# Patient Record
Sex: Female | Born: 1990 | Race: Black or African American | Hispanic: No | Marital: Single | Smoking: Never smoker
Health system: Southern US, Community
[De-identification: ages and names within clinical notes are randomized; demographics above are authoritative.]

## PROBLEM LIST (undated history)

## (undated) ENCOUNTER — Inpatient Hospital Stay: Payer: Self-pay

## (undated) DIAGNOSIS — O009 Unspecified ectopic pregnancy without intrauterine pregnancy: Secondary | ICD-10-CM

## (undated) DIAGNOSIS — E611 Iron deficiency: Secondary | ICD-10-CM

## (undated) DIAGNOSIS — D649 Anemia, unspecified: Secondary | ICD-10-CM

## (undated) DIAGNOSIS — J45909 Unspecified asthma, uncomplicated: Secondary | ICD-10-CM

---

## 2012-10-21 DIAGNOSIS — O009 Unspecified ectopic pregnancy without intrauterine pregnancy: Secondary | ICD-10-CM

## 2012-10-21 HISTORY — PX: LAPAROSCOPIC OOPHERECTOMY: SHX6507

## 2012-10-21 HISTORY — DX: Unspecified ectopic pregnancy without intrauterine pregnancy: O00.90

## 2013-04-02 ENCOUNTER — Emergency Department: Payer: Self-pay | Admitting: Emergency Medicine

## 2013-04-02 LAB — COMPREHENSIVE METABOLIC PANEL
Albumin: 3.9 g/dL (ref 3.4–5.0)
BUN: 9 mg/dL (ref 7–18)
Bilirubin,Total: 0.3 mg/dL (ref 0.2–1.0)
Calcium, Total: 8.9 mg/dL (ref 8.5–10.1)
Chloride: 106 mmol/L (ref 98–107)
Co2: 29 mmol/L (ref 21–32)
Creatinine: 0.56 mg/dL — ABNORMAL LOW (ref 0.60–1.30)
Potassium: 3.4 mmol/L — ABNORMAL LOW (ref 3.5–5.1)
SGOT(AST): 19 U/L (ref 15–37)
Sodium: 138 mmol/L (ref 136–145)
Total Protein: 7.8 g/dL (ref 6.4–8.2)

## 2013-04-02 LAB — CBC
HCT: 31.6 % — ABNORMAL LOW (ref 35.0–47.0)
HGB: 10.5 g/dL — ABNORMAL LOW (ref 12.0–16.0)
MCHC: 33.2 g/dL (ref 32.0–36.0)
MCV: 86 fL (ref 80–100)
RBC: 3.68 10*6/uL — ABNORMAL LOW (ref 3.80–5.20)
WBC: 6.2 10*3/uL (ref 3.6–11.0)

## 2013-04-02 LAB — GC/CHLAMYDIA PROBE AMP

## 2013-04-02 LAB — URINALYSIS, COMPLETE
Bilirubin,UR: NEGATIVE
Glucose,UR: NEGATIVE mg/dL (ref 0–75)
Leukocyte Esterase: NEGATIVE
Nitrite: NEGATIVE
Ph: 5 (ref 4.5–8.0)
Protein: NEGATIVE
RBC,UR: 1 /HPF (ref 0–5)
Specific Gravity: 1.028 (ref 1.003–1.030)
Squamous Epithelial: 3

## 2013-04-02 LAB — WET PREP, GENITAL

## 2013-04-15 ENCOUNTER — Emergency Department: Payer: Self-pay | Admitting: Emergency Medicine

## 2013-05-11 ENCOUNTER — Emergency Department: Payer: Self-pay | Admitting: Emergency Medicine

## 2013-05-11 LAB — CBC
HGB: 10.6 g/dL — ABNORMAL LOW (ref 12.0–16.0)
MCH: 27.9 pg (ref 26.0–34.0)
MCV: 86 fL (ref 80–100)
Platelet: 232 10*3/uL (ref 150–440)
RBC: 3.81 10*6/uL (ref 3.80–5.20)
RDW: 14.4 % (ref 11.5–14.5)

## 2013-05-11 LAB — BASIC METABOLIC PANEL
Anion Gap: 4 — ABNORMAL LOW (ref 7–16)
BUN: 14 mg/dL (ref 7–18)
Chloride: 107 mmol/L (ref 98–107)
Co2: 26 mmol/L (ref 21–32)
EGFR (African American): >60
Osmolality: 273 (ref 275–301)
Sodium: 137 mmol/L (ref 136–145)

## 2013-05-11 LAB — URINALYSIS, COMPLETE
Bilirubin,UR: NEGATIVE
Glucose,UR: NEGATIVE mg/dL (ref 0–75)
Ph: 5 (ref 4.5–8.0)
Protein: NEGATIVE
Squamous Epithelial: 7
WBC UR: 1 /HPF (ref 0–5)

## 2014-05-04 ENCOUNTER — Emergency Department: Payer: Self-pay | Admitting: Emergency Medicine

## 2014-05-04 LAB — CBC WITH DIFFERENTIAL/PLATELET
BASOS ABS: 0 10*3/uL (ref 0.0–0.1)
BASOS PCT: 0.4 %
Eosinophil #: 0.3 10*3/uL (ref 0.0–0.7)
Eosinophil %: 4.7 %
HCT: 34.8 % — ABNORMAL LOW (ref 35.0–47.0)
HGB: 10.9 g/dL — AB (ref 12.0–16.0)
Lymphocyte #: 1.6 10*3/uL (ref 1.0–3.6)
Lymphocyte %: 22.6 %
MCH: 26.9 pg (ref 26.0–34.0)
MCHC: 31.3 g/dL — AB (ref 32.0–36.0)
MCV: 86 fL (ref 80–100)
MONOS PCT: 7 %
Monocyte #: 0.5 x10 3/mm (ref 0.2–0.9)
NEUTROS PCT: 65.3 %
Neutrophil #: 4.8 10*3/uL (ref 1.4–6.5)
PLATELETS: 248 10*3/uL (ref 150–440)
RBC: 4.04 10*6/uL (ref 3.80–5.20)
RDW: 14.9 % — ABNORMAL HIGH (ref 11.5–14.5)
WBC: 7.3 10*3/uL (ref 3.6–11.0)

## 2014-05-04 LAB — HCG, QUANTITATIVE, PREGNANCY: Beta Hcg, Quant.: <1 m[IU]/mL — ABNORMAL LOW

## 2014-05-04 LAB — COMPREHENSIVE METABOLIC PANEL
ALBUMIN: 3.8 g/dL (ref 3.4–5.0)
ALK PHOS: 70 U/L
ANION GAP: 8 (ref 7–16)
AST: 22 U/L (ref 15–37)
BUN: 14 mg/dL (ref 7–18)
Bilirubin,Total: 0.3 mg/dL (ref 0.2–1.0)
CHLORIDE: 106 mmol/L (ref 98–107)
CREATININE: 0.69 mg/dL (ref 0.60–1.30)
Calcium, Total: 8.9 mg/dL (ref 8.5–10.1)
Co2: 24 mmol/L (ref 21–32)
EGFR (Non-African Amer.): >60
GLUCOSE: 87 mg/dL (ref 65–99)
OSMOLALITY: 276 (ref 275–301)
POTASSIUM: 3.6 mmol/L (ref 3.5–5.1)
SGPT (ALT): 14 U/L (ref 12–78)
SODIUM: 138 mmol/L (ref 136–145)
Total Protein: 7.9 g/dL (ref 6.4–8.2)

## 2014-05-04 LAB — GC/CHLAMYDIA PROBE AMP

## 2014-05-04 LAB — LIPASE, BLOOD: Lipase: 97 U/L (ref 73–393)

## 2014-05-04 LAB — WET PREP, GENITAL

## 2014-05-14 ENCOUNTER — Emergency Department: Payer: Self-pay | Admitting: Emergency Medicine

## 2014-05-14 LAB — CBC WITH DIFFERENTIAL/PLATELET
BASOS ABS: 0.1 10*3/uL (ref 0.0–0.1)
BASOS PCT: 0.7 %
Eosinophil #: 0.3 10*3/uL (ref 0.0–0.7)
Eosinophil %: 3.9 %
HCT: 34.8 % — AB (ref 35.0–47.0)
HGB: 11.4 g/dL — AB (ref 12.0–16.0)
LYMPHS ABS: 2.8 10*3/uL (ref 1.0–3.6)
LYMPHS PCT: 33.5 %
MCH: 28 pg (ref 26.0–34.0)
MCHC: 32.8 g/dL (ref 32.0–36.0)
MCV: 85 fL (ref 80–100)
Monocyte #: 0.7 x10 3/mm (ref 0.2–0.9)
Monocyte %: 8.2 %
NEUTROS PCT: 53.7 %
Neutrophil #: 4.5 10*3/uL (ref 1.4–6.5)
Platelet: 285 10*3/uL (ref 150–440)
RBC: 4.08 10*6/uL (ref 3.80–5.20)
RDW: 15.5 % — AB (ref 11.5–14.5)
WBC: 8.4 10*3/uL (ref 3.6–11.0)

## 2014-05-14 LAB — URINALYSIS, COMPLETE
Bilirubin,UR: NEGATIVE
Blood: NEGATIVE
Glucose,UR: NEGATIVE mg/dL (ref 0–75)
KETONE: NEGATIVE
LEUKOCYTE ESTERASE: NEGATIVE
NITRITE: NEGATIVE
PH: 6 (ref 4.5–8.0)
Protein: NEGATIVE
SPECIFIC GRAVITY: 1.025 (ref 1.003–1.030)
Squamous Epithelial: 7

## 2014-05-14 LAB — COMPREHENSIVE METABOLIC PANEL
ALBUMIN: 3.9 g/dL (ref 3.4–5.0)
ANION GAP: 8 (ref 7–16)
AST: 27 U/L (ref 15–37)
Alkaline Phosphatase: 82 U/L
BUN: 10 mg/dL (ref 7–18)
Bilirubin,Total: 0.3 mg/dL (ref 0.2–1.0)
CHLORIDE: 104 mmol/L (ref 98–107)
Calcium, Total: 9 mg/dL (ref 8.5–10.1)
Co2: 27 mmol/L (ref 21–32)
Creatinine: 0.73 mg/dL (ref 0.60–1.30)
EGFR (African American): >60
EGFR (Non-African Amer.): >60
GLUCOSE: 93 mg/dL (ref 65–99)
Osmolality: 276 (ref 275–301)
POTASSIUM: 3.7 mmol/L (ref 3.5–5.1)
SGPT (ALT): 20 U/L
Sodium: 139 mmol/L (ref 136–145)
TOTAL PROTEIN: 8.2 g/dL (ref 6.4–8.2)

## 2014-07-05 ENCOUNTER — Emergency Department: Payer: Self-pay | Admitting: Emergency Medicine

## 2014-07-05 LAB — CBC WITH DIFFERENTIAL/PLATELET
Basophil #: 0 10*3/uL (ref 0.0–0.1)
Basophil %: 0.5 %
EOS PCT: 5.4 %
Eosinophil #: 0.5 10*3/uL (ref 0.0–0.7)
HCT: 35.8 % (ref 35.0–47.0)
HGB: 11.4 g/dL — ABNORMAL LOW (ref 12.0–16.0)
Lymphocyte #: 2.5 10*3/uL (ref 1.0–3.6)
Lymphocyte %: 29.7 %
MCH: 27.8 pg (ref 26.0–34.0)
MCHC: 31.7 g/dL — AB (ref 32.0–36.0)
MCV: 88 fL (ref 80–100)
MONO ABS: 0.8 x10 3/mm (ref 0.2–0.9)
Monocyte %: 9.2 %
NEUTROS PCT: 55.2 %
Neutrophil #: 4.7 10*3/uL (ref 1.4–6.5)
PLATELETS: 250 10*3/uL (ref 150–440)
RBC: 4.09 10*6/uL (ref 3.80–5.20)
RDW: 16 % — ABNORMAL HIGH (ref 11.5–14.5)
WBC: 8.5 10*3/uL (ref 3.6–11.0)

## 2014-07-05 LAB — URINALYSIS, COMPLETE
Bilirubin,UR: NEGATIVE
Blood: NEGATIVE
GLUCOSE, UR: NEGATIVE mg/dL (ref 0–75)
Ketone: NEGATIVE
Leukocyte Esterase: NEGATIVE
NITRITE: NEGATIVE
Ph: 5 (ref 4.5–8.0)
Protein: NEGATIVE
RBC,UR: <1 /HPF (ref 0–5)
SPECIFIC GRAVITY: 1.032 (ref 1.003–1.030)
WBC UR: 2 /HPF (ref 0–5)

## 2014-07-05 LAB — COMPREHENSIVE METABOLIC PANEL
Albumin: 3.8 g/dL (ref 3.4–5.0)
Alkaline Phosphatase: 62 U/L
Anion Gap: 5 — ABNORMAL LOW (ref 7–16)
BILIRUBIN TOTAL: 0.3 mg/dL (ref 0.2–1.0)
BUN: 15 mg/dL (ref 7–18)
CALCIUM: 8.9 mg/dL (ref 8.5–10.1)
Chloride: 108 mmol/L — ABNORMAL HIGH (ref 98–107)
Co2: 27 mmol/L (ref 21–32)
Creatinine: 0.72 mg/dL (ref 0.60–1.30)
EGFR (African American): >60
GLUCOSE: 89 mg/dL (ref 65–99)
Osmolality: 280 (ref 275–301)
Potassium: 3.5 mmol/L (ref 3.5–5.1)
SGOT(AST): 12 U/L — ABNORMAL LOW (ref 15–37)
SGPT (ALT): 15 U/L
SODIUM: 140 mmol/L (ref 136–145)
Total Protein: 7.6 g/dL (ref 6.4–8.2)

## 2014-07-16 ENCOUNTER — Emergency Department: Payer: Self-pay | Admitting: Emergency Medicine

## 2014-08-07 ENCOUNTER — Emergency Department: Payer: Self-pay | Admitting: Emergency Medicine

## 2014-08-07 LAB — URINALYSIS, COMPLETE
Bacteria: NONE SEEN
Bilirubin,UR: NEGATIVE
Blood: NEGATIVE
Glucose,UR: NEGATIVE mg/dL (ref 0–75)
KETONE: NEGATIVE
Nitrite: NEGATIVE
Ph: 6 (ref 4.5–8.0)
Protein: NEGATIVE
RBC,UR: 10 /HPF (ref 0–5)
SPECIFIC GRAVITY: 1.026 (ref 1.003–1.030)
Squamous Epithelial: 7

## 2014-11-29 ENCOUNTER — Emergency Department: Payer: Self-pay | Admitting: Emergency Medicine

## 2014-12-10 ENCOUNTER — Emergency Department: Payer: Self-pay | Admitting: Emergency Medicine

## 2015-10-22 NOTE — L&D Delivery Note (Signed)
VAGINAL DELIVERY NOTE:  Date of Delivery: 08/14/2016 Primary OB: [facility]  Gestational Age/EDD: 817w1d 08/13/2016, by Last Menstrual Period Antepartum complications: tachysystole on Cervidil, Varible decels 2nd stage Attending Physician: CJones, CNM Delivery Type: spontaneous vaginal delivery  Anesthesia: none Laceration: n/a and none Episiotomy: none Placenta: spontaneous Intrapartum complications: epidural ineffective and rapid labor process Estimated Blood Loss:3600ml GBS: neg Procedure Details: CTSP due to C/C/+2 with inability to control UC's and pushing. Enc to push and in Lithotomy position. FOB with pt. Vtx, Ant shoulder del and then post shouder and body del completely at  1042, No CAN noted. To mom's abd for skin to skin bonding. Apgars 8-9, SDOP intact at 1045. 3 VC noted. No lacs, FF and lochia mod. After 2 mins, pt began to bleed approx 300 mls with FF and bleeding stopped. Pitocin IV drip running wide open. Needle and sponge count correct. Bonding with baby. EBL: 350 ml's.   Baby: Liveborn female, Apgars 8/9, baby named "Sariyah"

## 2016-01-12 LAB — OB RESULTS CONSOLE HIV ANTIBODY (ROUTINE TESTING): HIV: NONREACTIVE

## 2016-01-12 LAB — OB RESULTS CONSOLE VARICELLA ZOSTER ANTIBODY, IGG: Varicella: IMMUNE

## 2016-01-12 LAB — OB RESULTS CONSOLE RPR: RPR: NONREACTIVE

## 2016-01-12 LAB — OB RESULTS CONSOLE GC/CHLAMYDIA
CHLAMYDIA, DNA PROBE: NEGATIVE
Gonorrhea: NEGATIVE

## 2016-01-12 LAB — OB RESULTS CONSOLE RUBELLA ANTIBODY, IGM: Rubella: IMMUNE

## 2016-01-12 LAB — OB RESULTS CONSOLE HEPATITIS B SURFACE ANTIGEN: Hepatitis B Surface Ag: NEGATIVE

## 2016-07-04 ENCOUNTER — Encounter: Payer: Self-pay | Admitting: *Deleted

## 2016-07-04 ENCOUNTER — Observation Stay
Admission: EM | Admit: 2016-07-04 | Discharge: 2016-07-04 | Disposition: A | Discharge status: Home / Self Care | Payer: Medicaid Other | Attending: Obstetrics and Gynecology | Admitting: Obstetrics and Gynecology

## 2016-07-04 DIAGNOSIS — Z3483 Encounter for supervision of other normal pregnancy, third trimester: Secondary | ICD-10-CM | POA: Diagnosis present

## 2016-07-04 HISTORY — DX: Unspecified asthma, uncomplicated: J45.909

## 2016-07-04 LAB — URINALYSIS COMPLETE WITH MICROSCOPIC (ARMC ONLY)
BILIRUBIN URINE: NEGATIVE
Bacteria, UA: NONE SEEN
Glucose, UA: NEGATIVE mg/dL
Hgb urine dipstick: NEGATIVE
Ketones, ur: NEGATIVE mg/dL
Leukocytes, UA: NEGATIVE
NITRITE: NEGATIVE
PH: 7 (ref 5.0–8.0)
Protein, ur: NEGATIVE mg/dL
SPECIFIC GRAVITY, URINE: 1.013 (ref 1.005–1.030)

## 2016-07-04 MED ORDER — OXYCODONE-ACETAMINOPHEN 5-325 MG PO TABS: 1.0000 | ORAL_TABLET | ORAL | Status: DC | PRN

## 2016-07-04 MED ORDER — ACETAMINOPHEN 325 MG PO TABS: 650.0000 mg | ORAL_TABLET | ORAL | Status: DC | PRN

## 2016-07-04 MED ORDER — LACTATED RINGERS IV SOLN: 500.0000 mL | INTRAVENOUS | Status: DC | PRN

## 2016-07-04 NOTE — OB Triage Note (Signed)
Patient states she lost her mucus plug today at 1230. Pt also states she had been having lower abdominal/vaginal pressure for a week. Denies LOF, Vaginal bleeding or any other concerns.

## 2016-07-04 NOTE — Progress Notes (Signed)
[place] and just moved here to [place]. She is  A Gravida 3 P1011with no  PNC in this area yet. LMP was 11/07/15 with EDD of 08/13/16. [facility] for c/o "losing mucus plug". Past Medical History:  Diagnosis Date  . Asthma    Past Surgical History:  Procedure Laterality Date  . LAPAROSCOPIC OOPHERECTOMY Left 2014   Family History  Problem Relation Age of Onset  . Diabetes Maternal Grandmother   . Heart disease Maternal Grandmother    Social History   Social History  . Marital status: Single    Spouse name: N/A  . Number of children: N/A  . Years of education: N/A   Occupational History  . Not on file.   Social History Main Topics  . Smoking status: Never Smoker  . Smokeless tobacco: Never Used  . Alcohol use No  . Drug use: No  . Sexual activity: Yes    Birth control/ protection: Implant, IUD   Other Topics Concern  . Not on file   Social History Narrative  . No narrative on file  Pt having urine eval which was neg and NST which was reactive with 2 accels 15 x 15 BPM. Pt was not in labor and was discharged with instructions to fu [facility] next week for tansfer of care at 34 weeks. No records available and advised staff to get the records.  Gen: 25 yo black female in NAD.  FHR: 130-135, reactive NST with 2 accels 15 x 15 BPM. Toco: No UC's noted.  A: IUP at 34 weeks with mucus plug expulsion 2. PNC in [place] is lacking due to recent move P: Advised [facility] for transfer of care. 2. FKC's. 3. FU for any labor S/S.

## 2016-07-09 LAB — OB RESULTS CONSOLE GBS: GBS: NEGATIVE

## 2016-07-21 ENCOUNTER — Observation Stay
Admission: EM | Admit: 2016-07-21 | Discharge: 2016-07-21 | Disposition: A | Discharge status: Home / Self Care | Payer: Medicaid Other | Attending: Obstetrics and Gynecology | Admitting: Obstetrics and Gynecology

## 2016-07-21 DIAGNOSIS — Z87891 Personal history of nicotine dependence: Secondary | ICD-10-CM | POA: Insufficient documentation

## 2016-07-21 DIAGNOSIS — O212 Late vomiting of pregnancy: Principal | ICD-10-CM | POA: Insufficient documentation

## 2016-07-21 DIAGNOSIS — O4700 False labor before 37 completed weeks of gestation, unspecified trimester: Secondary | ICD-10-CM | POA: Diagnosis present

## 2016-07-21 DIAGNOSIS — O479 False labor, unspecified: Secondary | ICD-10-CM | POA: Diagnosis present

## 2016-07-21 DIAGNOSIS — Z3A36 36 weeks gestation of pregnancy: Secondary | ICD-10-CM | POA: Insufficient documentation

## 2016-07-21 HISTORY — DX: Unspecified ectopic pregnancy without intrauterine pregnancy: O00.90

## 2016-07-21 HISTORY — DX: Iron deficiency: E61.1

## 2016-07-21 MED ORDER — MORPHINE SULFATE (PF) 2 MG/ML IV SOLN
INTRAVENOUS | Status: AC
Start: 2016-07-21 — End: 2016-07-21
  Administered 2016-07-21: 2 mg via INTRAVENOUS
  Filled 2016-07-21: qty 1

## 2016-07-21 MED ORDER — LACTATED RINGERS IV BOLUS (SEPSIS)
1000.0000 mL | Freq: Once | INTRAVENOUS | Status: AC
  Administered 2016-07-21: 1000 mL via INTRAVENOUS

## 2016-07-21 MED ORDER — PROMETHAZINE HCL 25 MG/ML IJ SOLN
25.0000 mg | Freq: Once | INTRAMUSCULAR | Status: DC
  Filled 2016-07-21: qty 1

## 2016-07-21 MED ORDER — MORPHINE SULFATE (PF) 2 MG/ML IV SOLN
2.0000 mg | Freq: Once | INTRAVENOUS | Status: AC
Start: 2016-07-21 — End: 2016-07-21
  Administered 2016-07-21: 2 mg via INTRAVENOUS

## 2016-07-21 MED ORDER — PROMETHAZINE HCL 25 MG/ML IJ SOLN
INTRAMUSCULAR | Status: AC
  Administered 2016-07-21: 25 mg
  Filled 2016-07-21: qty 1

## 2016-07-21 MED ORDER — SODIUM CHLORIDE FLUSH 0.9 % IV SOLN
INTRAVENOUS | Status: AC
  Administered 2016-07-21: 10 mL
  Filled 2016-07-21: qty 20

## 2016-07-21 MED ORDER — LACTATED RINGERS IV BOLUS (SEPSIS): 500.0000 mL | Freq: Once | INTRAVENOUS | Status: DC

## 2016-07-21 MED ORDER — HYDROCODONE-ACETAMINOPHEN 5-325 MG PO TABS
1.0000 | ORAL_TABLET | Freq: Once | ORAL | Status: AC
  Administered 2016-07-21: 1 via ORAL
  Filled 2016-07-21: qty 1

## 2016-07-21 NOTE — Progress Notes (Signed)
Patient ID: Kayla Rodriguez, female   DOB: 1991/02/22, 25 y.o.   MRN: [id number] Kayla Rodriguez 1991/02/22 G3 P1 at  215w5d presents for n/v and contractions  + LOF , no vaginal bleeding , CTX started at 0400 . Just moved and is initiating [facility]  PmHX : ovarian cyst , ectopic pregnancy, asthma PSHX : ovarian cyst removal  Ros: unremakable Allergies : nkda  Social :former tobacco use .   O;BP 119/77 (BP Location: Right Arm)   Pulse 83   Temp 97.8 F (36.6 C) (Oral)   Resp 16   Ht 5\' 2"  (1.575 m)   Wt 144 lb 12.8 oz (65.7 kg)   LMP 11/07/2015   BMI 26.48 kg/m  ABD gravid , soft  CX 1.5 cm , nitrazine neg by RN  NST reactive  125-130 , + accels , no decels . CTX irregular  Labs: neg Nitrazine  A: n/v with difficulty keeping fluids down  P:1 liter IVF  2 mg Morphine IV  25 mg phenergan  After fluids , if no cervix change d/c home

## 2016-07-21 NOTE — Progress Notes (Signed)
Vicodin given, pt sound asleep, awakes to verbal stimuli and touch. Provided pt and s/o with additional discharge instructions and handout. Advised pt to rest, advance diet as tolerated and follow-up prn. Pt and s/o verbalized understanding. PIV d'ced, pt denies feeling pain while asleep, assisted with getting dressed and accompanied to front lobby via WC where s/o was waiting to drive pt home.

## 2016-07-21 NOTE — Discharge Summary (Signed)
  Patient ID: Kayla Rodriguez, female   DOB: 1991/10/10, 25 y.o.   MRN: [id number] Kayla Nortonshley Hoen 1991/10/10 G3 P1 at  3779w5d presents for n/v and contractions  + LOF , no vaginal bleeding , CTX started at 0400 . [facility]  PmHX : ovarian cyst , ectopic pregnancy, asthma PSHX : ovarian cyst removal  Ros: unremakable Allergies : nkda  Social :former tobacco use .   O;BP 119/77 (BP Location: Right Arm)   Pulse 83   Temp 97.8 F (36.6 C) (Oral)   Resp 16   Ht 5\' 2"  (1.575 m)   Wt 144 lb 12.8 oz (65.7 kg)   LMP 11/07/2015   BMI 26.48 kg/m  ABD gravid , soft  CX 1.5 cm , nitrazine neg by RN  NST reactive  125-130 , + accels , no decels . CTX irregular  Labs: neg Nitrazine  A: n/v with difficulty keeping fluids down  P:1 liter IVF  2 mg Morphine IV  25 mg phenergan  After fluids , if no cervix change d/c home      Electronically signed by Suzy Bouchardhomas J Schermerhorn, MD at 07/21/2016 8:15 PM

## 2016-07-21 NOTE — Discharge Instructions (Signed)
FHT reactive, abdominal pain remains same despite Phenergan and Morphine. Pt states she is drowsy and sleepy but unable to rest because she continues to feel same pain, rates 8/10 and frequency with contractions every 3 mins. VSS, afebrile. Pt tolerating po sips of gingerale and crackers. Vicodin 5/325 mg po given po. Discharge instructions and teaching completed with pt and s/o, preterm labor precautions discussed with s/s that pt shoul report or return to hospital if notices. Pt confirms she lives about 5 mins from hospital and has OB appt scheduled for Thursday. Discharge paperwork including handouts related to preterm labor precautions and diet to follow with nausea/vomiting in pregnancy provided to pt, encouraged pt to rest, take warm shower, stay well hydrated and advance diet as tolerated, then f/u with primary OB as scheduled. Pt is aware she may return to hospital with any worsening symptoms. Pt reports active fetal movement and nausea and vomiting has improved prior to discharge.

## 2016-07-21 NOTE — OB Triage Note (Addendum)
Pt presents to triage with c/o painful contractions felt mostly in lower abdomen, coming every 3 mins since 0430a, says she was asleep, work d/t sharp stomach pain, voided then started feeling nauseated, vomited about 3 times today. Having constipation, pain and pressure with urinating, noticing frequency and hesitancy. This evening began having headache pain, rates overall pain 8/10 Says she has not been able to keep anything down but tolerating popsicles. Confirms active fetal movement. Denies leaking fluid, spotting, vaginal bleeding, diarrhea, fever or chills. Noticed some wetness on panties but thinks it could have be urine while she was throwing up.

## 2016-07-23 ENCOUNTER — Observation Stay
Admission: EM | Admit: 2016-07-23 | Discharge: 2016-07-23 | Disposition: A | Discharge status: Home / Self Care | Payer: Medicaid Other | Attending: Obstetrics and Gynecology | Admitting: Obstetrics and Gynecology

## 2016-07-23 ENCOUNTER — Encounter: Payer: Self-pay | Admitting: *Deleted

## 2016-07-23 DIAGNOSIS — Z3A37 37 weeks gestation of pregnancy: Secondary | ICD-10-CM | POA: Diagnosis not present

## 2016-07-23 DIAGNOSIS — O471 False labor at or after 37 completed weeks of gestation: Principal | ICD-10-CM | POA: Insufficient documentation

## 2016-07-23 DIAGNOSIS — O479 False labor, unspecified: Secondary | ICD-10-CM | POA: Diagnosis present

## 2016-07-23 NOTE — Discharge Instructions (Signed)
Pt. Is to keep [facility]. Labor precautions reviewed with patient, patient verbalized understanding of all discharge instructions. Copy given.

## 2016-07-23 NOTE — OB Triage Note (Signed)
Uterine cramps started Sunday, pt. Found relief when she walks the floor, rest, rocking or S.O. rubs her stomach.  Positive for fetal movement, denies gush of fluid, denies vaginal bleeding, last sexual encounter was yesterday. External monitor - U/S applied - FHR 140s, Toco in place - abd. Soft.

## 2016-07-23 NOTE — Discharge Summary (Signed)
Merita Nortonshley Luzader [birth year] G3 P1 9610w0d presents for uterine ctx noLOF , no vaginal bleeding , O;BP 111/66 (BP Location: Right Arm)   Pulse 89   Temp 98.3 F (36.8 C)   Resp 18   Ht 5\' 2"  (1.575 m)   Wt 144 lb (65.3 kg)   LMP 11/07/2015   BMI 26.34 kg/m  cx by RN 1 cm  NSTreactive NST , no decels   Ano labor  Reassuring fetal monitoring  P:d/c home

## 2016-07-26 ENCOUNTER — Observation Stay
Admission: EM | Admit: 2016-07-26 | Discharge: 2016-07-26 | Disposition: A | Discharge status: Home / Self Care | Payer: Medicaid Other | Attending: Obstetrics and Gynecology | Admitting: Obstetrics and Gynecology

## 2016-07-26 ENCOUNTER — Encounter: Payer: Self-pay | Admitting: *Deleted

## 2016-07-26 DIAGNOSIS — Z3A37 37 weeks gestation of pregnancy: Secondary | ICD-10-CM | POA: Insufficient documentation

## 2016-07-26 DIAGNOSIS — O479 False labor, unspecified: Secondary | ICD-10-CM | POA: Diagnosis present

## 2016-07-26 DIAGNOSIS — O471 False labor at or after 37 completed weeks of gestation: Principal | ICD-10-CM | POA: Insufficient documentation

## 2016-07-26 NOTE — Discharge Summary (Signed)
  37 weeks with UTx ctx . No LOF , No vaginal bleeding  O;VSS NST reactive, no CTX Reactive NST  A: d/c home  Precautions

## 2016-07-26 NOTE — OB Triage Note (Signed)
Recvd to OBS3 with C/O abd pain since last evening and losing mucous plug.  No bleeding or leaking fluid.  Oriented to room and plan of care.  Verbalized understanding and agrees with plan.

## 2016-07-26 NOTE — Progress Notes (Signed)
Discharge instructions given and explained.  Verbalized understanding.  Up to change clothes for discharge.

## 2016-08-03 ENCOUNTER — Inpatient Hospital Stay
Admission: EM | Admit: 2016-08-03 | Discharge: 2016-08-04 | Disposition: A | Discharge status: Home / Self Care | Payer: Medicaid Other | Attending: Obstetrics and Gynecology | Admitting: Obstetrics and Gynecology

## 2016-08-03 DIAGNOSIS — O471 False labor at or after 37 completed weeks of gestation: Secondary | ICD-10-CM | POA: Diagnosis not present

## 2016-08-03 DIAGNOSIS — Z3A38 38 weeks gestation of pregnancy: Secondary | ICD-10-CM | POA: Diagnosis not present

## 2016-08-03 DIAGNOSIS — O212 Late vomiting of pregnancy: Secondary | ICD-10-CM | POA: Diagnosis not present

## 2016-08-03 LAB — TYPE AND SCREEN
ABO/RH(D): O POS
Antibody Screen: NEGATIVE

## 2016-08-03 LAB — CBC
HCT: 38 % (ref 35.0–47.0)
Hemoglobin: 13.4 g/dL (ref 12.0–16.0)
MCH: 31.7 pg (ref 26.0–34.0)
MCHC: 35.2 g/dL (ref 32.0–36.0)
MCV: 90.1 fL (ref 80.0–100.0)
PLATELETS: 207 10*3/uL (ref 150–440)
RBC: 4.21 MIL/uL (ref 3.80–5.20)
RDW: 14.9 % — AB (ref 11.5–14.5)
WBC: 8.1 10*3/uL (ref 3.6–11.0)

## 2016-08-03 MED ORDER — CALCIUM CARBONATE ANTACID 500 MG PO CHEW: 2.0000 | CHEWABLE_TABLET | ORAL | Status: DC | PRN

## 2016-08-03 MED ORDER — LACTATED RINGERS IV SOLN
INTRAVENOUS | Status: DC
  Administered 2016-08-03 – 2016-08-04 (×2): via INTRAVENOUS

## 2016-08-03 MED ORDER — HYDROXYZINE HCL 50 MG PO TABS
50.0000 mg | ORAL_TABLET | Freq: Four times a day (QID) | ORAL | Status: DC | PRN
  Administered 2016-08-03: 50 mg via ORAL
  Filled 2016-08-03 (×2): qty 1

## 2016-08-03 MED ORDER — MORPHINE SULFATE (PF) 4 MG/ML IV SOLN
5.0000 mg | Freq: Once | INTRAVENOUS | Status: AC | PRN
  Administered 2016-08-03: 5 mg via INTRAVENOUS
  Filled 2016-08-03: qty 2

## 2016-08-03 MED ORDER — ACETAMINOPHEN 325 MG PO TABS: 650.0000 mg | ORAL_TABLET | ORAL | Status: DC | PRN

## 2016-08-03 MED ORDER — MORPHINE SULFATE 50 MG/ML IV SOLN: 5.0000 mg/h | Freq: Once | INTRAVENOUS | Status: DC

## 2016-08-03 MED ORDER — MORPHINE SULFATE (PF) 4 MG/ML IV SOLN: 5.0000 mg | Freq: Once | INTRAVENOUS | Status: DC | PRN

## 2016-08-03 MED ORDER — MORPHINE SULFATE (PF) 4 MG/ML IV SOLN
5.0000 mg | Freq: Once | INTRAVENOUS | Status: AC
  Administered 2016-08-03: 5 mg via INTRAMUSCULAR
  Filled 2016-08-03: qty 2

## 2016-08-03 NOTE — OB Triage Note (Signed)
Patient arrived to LDR 2 via EMS with c/o contractions since approx 1800.  Denies leaking of fluid and vaginal bleeding. States contractions are "back to back".  Positive fetal movement. EFM initiated. SVE per RN as noted.  Discussed plan of care with patient. Patient verbalized understanding.

## 2016-08-04 DIAGNOSIS — O471 False labor at or after 37 completed weeks of gestation: Secondary | ICD-10-CM | POA: Diagnosis not present

## 2016-08-04 LAB — URINALYSIS COMPLETE WITH MICROSCOPIC (ARMC ONLY)
BACTERIA UA: NONE SEEN
Bilirubin Urine: NEGATIVE
Glucose, UA: NEGATIVE mg/dL
HGB URINE DIPSTICK: NEGATIVE
Ketones, ur: NEGATIVE mg/dL
LEUKOCYTES UA: NEGATIVE
Nitrite: NEGATIVE
PH: 7 (ref 5.0–8.0)
Protein, ur: NEGATIVE mg/dL
Specific Gravity, Urine: 1.004 — ABNORMAL LOW (ref 1.005–1.030)

## 2016-08-04 LAB — URINE DRUG SCREEN, QUALITATIVE (ARMC ONLY)
Amphetamines, Ur Screen: NOT DETECTED
BARBITURATES, UR SCREEN: NOT DETECTED
BENZODIAZEPINE, UR SCRN: NOT DETECTED
Cannabinoid 50 Ng, Ur ~~LOC~~: NOT DETECTED
Cocaine Metabolite,Ur ~~LOC~~: NOT DETECTED
MDMA (Ecstasy)Ur Screen: NOT DETECTED
METHADONE SCREEN, URINE: NOT DETECTED
Opiate, Ur Screen: POSITIVE — AB
Phencyclidine (PCP) Ur S: NOT DETECTED
TRICYCLIC, UR SCREEN: NOT DETECTED

## 2016-08-04 LAB — COMPREHENSIVE METABOLIC PANEL
ALBUMIN: 3.6 g/dL (ref 3.5–5.0)
ALK PHOS: 142 U/L — AB (ref 38–126)
ALT: 15 U/L (ref 14–54)
AST: 26 U/L (ref 15–41)
Anion gap: 9 (ref 5–15)
BILIRUBIN TOTAL: 0.8 mg/dL (ref 0.3–1.2)
BUN: 6 mg/dL (ref 6–20)
CO2: 22 mmol/L (ref 22–32)
Calcium: 10.1 mg/dL (ref 8.9–10.3)
Chloride: 105 mmol/L (ref 101–111)
Creatinine, Ser: 0.48 mg/dL (ref 0.44–1.00)
GFR calc Af Amer: >60 mL/min (ref >60)
GFR calc non Af Amer: >60 mL/min (ref >60)
GLUCOSE: 85 mg/dL (ref 65–99)
POTASSIUM: 3.4 mmol/L — AB (ref 3.5–5.1)
Sodium: 136 mmol/L (ref 135–145)
TOTAL PROTEIN: 7.7 g/dL (ref 6.5–8.1)

## 2016-08-04 MED ORDER — PROMETHAZINE HCL 25 MG/ML IJ SOLN: 12.5000 mg | Freq: Four times a day (QID) | INTRAMUSCULAR | Status: DC | PRN

## 2016-08-04 MED ORDER — MORPHINE SULFATE (PF) 4 MG/ML IV SOLN
4.0000 mg | Freq: Once | INTRAVENOUS | Status: AC | PRN
  Administered 2016-08-04: 4 mg via INTRAVENOUS
  Filled 2016-08-04: qty 1

## 2016-08-04 MED ORDER — ONDANSETRON HCL 4 MG/2ML IJ SOLN
4.0000 mg | Freq: Three times a day (TID) | INTRAMUSCULAR | Status: DC | PRN
  Administered 2016-08-04: 4 mg via INTRAVENOUS
  Filled 2016-08-04: qty 2

## 2016-08-04 MED ORDER — MORPHINE SULFATE (PF) 4 MG/ML IV SOLN
5.0000 mg | Freq: Once | INTRAVENOUS | Status: AC
  Administered 2016-08-04: 5 mg via INTRAMUSCULAR
  Filled 2016-08-04: qty 2

## 2016-08-04 NOTE — Final Progress Note (Signed)
TRIAGE VISIT with NST   Kayla Rodriguez is a 25 y.o. G3P1011. She is at 5547w5d gestation.  She presented last night in significant pain with vomiting and contractions, looking as though she were about to deliver. However, her cervix was 2 cm dilated. Good fetal movement, no LOF. She was admitted for overnight observation and morphine rested with sleeping. Her pain increased as the morphine wore off, but despite intermittent contractions did not make cervical change. Her fetal monitoring was reassuring with +accels, no decels (apparent ones while she was sleeping were positional artifacts of the monitor) and moderate variability.  Of note, her UDS was positive for opiates after she received her first dose of morphine.  Her labs and vitals were normal. Her CMP is still pending.  O:  BP 109/63   Pulse 72   Temp 98.4 F (36.9 C) (Oral)   Resp 20   Ht 5\' 2"  (1.575 m)   Wt 147 lb (66.7 kg)   LMP 11/07/2015   SpO2 100%   BMI 26.89 kg/m  Results for orders placed or performed during the hospital encounter of 08/03/16 (from the past 48 hour(s))  CBC on admission   Collection Time: 08/03/16  8:59 PM  Result Value Ref Range   WBC 8.1 3.6 - 11.0 K/uL   RBC 4.21 3.80 - 5.20 MIL/uL   Hemoglobin 13.4 12.0 - 16.0 g/dL   HCT 16.138.0 09.635.0 - 04.547.0 %   MCV 90.1 80.0 - 100.0 fL   MCH 31.7 26.0 - 34.0 pg   MCHC 35.2 32.0 - 36.0 g/dL   RDW 40.914.9 (H) 81.111.5 - 91.414.5 %   Platelets 207 150 - 440 K/uL  Type and screen [facility]   Collection Time: 08/03/16  8:59 PM  Result Value Ref Range   ABO/RH(D) O POS    Antibody Screen NEG    Sample Expiration 08/06/2016   Urinalysis complete, with microscopic ([facility] only)   Collection Time: 08/03/16  9:01 PM  Result Value Ref Range   Color, Urine STRAW (A) YELLOW   APPearance CLEAR (A) CLEAR   Glucose, UA NEGATIVE NEGATIVE mg/dL   Bilirubin Urine NEGATIVE NEGATIVE   Ketones, ur NEGATIVE NEGATIVE mg/dL   Specific Gravity, Urine 1.004 (L) 1.005 -  1.030   Hgb urine dipstick NEGATIVE NEGATIVE   pH 7.0 5.0 - 8.0   Protein, ur NEGATIVE NEGATIVE mg/dL   Nitrite NEGATIVE NEGATIVE   Leukocytes, UA NEGATIVE NEGATIVE   RBC / HPF 0-5 0 - 5 RBC/hpf   WBC, UA 0-5 0 - 5 WBC/hpf   Bacteria, UA NONE SEEN NONE SEEN   Squamous Epithelial / LPF 0-5 (A) NONE SEEN   Mucous PRESENT   Urine Drug Screen, Qualitative ([facility] only)   Collection Time: 08/03/16  9:02 PM  Result Value Ref Range   Tricyclic, Ur Screen NONE DETECTED NONE DETECTED   Amphetamines, Ur Screen NONE DETECTED NONE DETECTED   MDMA (Ecstasy)Ur Screen NONE DETECTED NONE DETECTED   Cocaine Metabolite,Ur [place] NONE DETECTED NONE DETECTED   Opiate, Ur Screen POSITIVE (A) NONE DETECTED   Phencyclidine (PCP) Ur S NONE DETECTED NONE DETECTED   Cannabinoid 50 Ng, Ur [place] NONE DETECTED NONE DETECTED   Barbiturates, Ur Screen NONE DETECTED NONE DETECTED   Benzodiazepine, Ur Scrn NONE DETECTED NONE DETECTED   Methadone Scn, Ur NONE DETECTED NONE DETECTED     Gen: NAD, AAOx3      Abd: FNTTP      Ext: Non-tender, Nonedmeatous  FHT: 130, mod var, +accels, no decels TOCO: quiet SVE: Dilation: 2 Effacement (%): 50 Cervical Position: Posterior Station: -3 Presentation: Vertex Exam by:: [facility] RN   A/P:  25 y.o. G3P1011 [redacted]w[redacted]d with contractions, not in active labor.   Labor: not present.    Fetal Wellbeing: NST is Reassuring Cat 1 tracing.  D/c home stable, precautions reviewed, follow-up as scheduled.

## 2016-08-04 NOTE — OB Triage Note (Signed)
Pt given IM med as ordered and discharged to home via wheelchair. Accompanied by significant other

## 2016-08-05 LAB — RPR: RPR: NONREACTIVE

## 2016-08-09 ENCOUNTER — Encounter: Payer: Self-pay | Admitting: *Deleted

## 2016-08-09 ENCOUNTER — Observation Stay
Admission: EM | Admit: 2016-08-09 | Discharge: 2016-08-09 | Disposition: A | Discharge status: Home / Self Care | Payer: Medicaid Other | Attending: Obstetrics and Gynecology | Admitting: Obstetrics and Gynecology

## 2016-08-09 DIAGNOSIS — K5900 Constipation, unspecified: Secondary | ICD-10-CM | POA: Diagnosis not present

## 2016-08-09 DIAGNOSIS — O99613 Diseases of the digestive system complicating pregnancy, third trimester: Principal | ICD-10-CM | POA: Insufficient documentation

## 2016-08-09 DIAGNOSIS — Z3A39 39 weeks gestation of pregnancy: Secondary | ICD-10-CM | POA: Diagnosis not present

## 2016-08-09 MED ORDER — LACTATED RINGERS IV SOLN: 500.0000 mL | INTRAVENOUS | Status: DC | PRN

## 2016-08-09 MED ORDER — OXYCODONE-ACETAMINOPHEN 5-325 MG PO TABS: 1.0000 | ORAL_TABLET | ORAL | Status: DC | PRN

## 2016-08-09 MED ORDER — ACETAMINOPHEN 325 MG PO TABS: 650.0000 mg | ORAL_TABLET | ORAL | Status: DC | PRN

## 2016-08-09 NOTE — Discharge Summary (Signed)
Patient discharged home, discharge instructions given, patient states understanding. Patient left floor in stable condition, denies any other needs at this time. Patient to keep scheduled induction date

## 2016-08-09 NOTE — OB Triage Note (Signed)
Patient states she she had her membranes stripped yesterday and started having "pressure" pain in her abdomen and "stabbing in her vaginal area since 0500. Denies any bleeding, states baby is moving and that she has lost her mucus plug. Pt states she has not had a BM in about a week and it was small, and has not had a normal BM in 2 weeks and her biggest complaint is she feels she needs to "poop" but is scared to push.

## 2016-08-09 NOTE — Discharge Summary (Signed)
Obstetric Discharge Summary   Patient ID: Kayla Rodriguez MRN: [id number] DOB/AGE: [birth year] 25 y.o.   Date of Admission: 08/09/2016  Date of Discharge: 08/09/16  Admitting Diagnosis: IUP at 3580w3d  Secondary Diagnosis: Constipation     Discharge Diagnosis: Term pregnancy with constipation, NST with reactive strip, 2 accels 15 x 15 BPM noted   Antepartum Procedures: Soap suds enema   Post partum procedures: None  Complications:[facility]  [facility]  Kayla Rodriguez is a [id number] with EDD of August 12, 2016 and is scheduled for IOL who had c/o "stomach pain and has not had a BM for 2 weeks. Pt has some UC's but, is hurting due to constipation.Patient had an uncomplicated antenatal course.  By time of discharge, pt had a huge result of BM and feels somewhat better. She has some UC's but, is not in labor. Cx non-reachable by RN. Pt was 4 cms at office in the past.Pt given instructions for constipation and will fu here if Labor begins.   Labs: CBC Latest Ref Rng & Units 08/03/2016 07/05/2014 05/14/2014  WBC 3.6 - 11.0 K/uL 8.1 8.5 8.4  Hemoglobin 12.0 - 16.0 g/dL 44.013.4 11.4(L) 11.4(L)  Hematocrit 35.0 - 47.0 % 38.0 35.8 34.8(L)  Platelets 150 - 440 K/uL 207 250 285   O POS  Physical exam:  Blood pressure 110/68, pulse 83, temperature 98.7 F (37.1 C), temperature source Oral, resp. rate 18, last menstrual period 11/07/2015. General: alert and no distress Lungs: CTA bilat, no W/R/R. Resp: reg and non-labored.  Abdomen:Gravid. Extremities: No evidence of DVT seen on physical exam. No lower extremity edema.  Discharge Instructions: Per After Visit Summary. Activity: Advance as tolerated.  Also refer to After Visit Summary Diet: Regular  Outpatient follow up:   Discharged Condition: stable  Discharged NU:UVOZto:home   Sharee Pimplearon W Jones, CNM 08/09/2016

## 2016-08-09 NOTE — Discharge Instructions (Signed)

## 2016-08-12 ENCOUNTER — Other Ambulatory Visit: Payer: Self-pay | Admitting: Obstetrics and Gynecology

## 2016-08-12 NOTE — H&P (Signed)
OB ADMISSION/ HISTORY & PHYSICAL:  Admission Date: 08/13/16  Admit Diagnosis: Elective IOL at 40 weeks   Kayla Rodriguez is a 25 y.o. female G3P1 at 5640 weeks presenting for elective IOL.   Prenatal History: G3P1011   EDC : 08/13/2016, by Last Menstrual Period  [facility] Prenatal course complicated by late transfer to care, low lying placenta - resolved, anemia on FE BID  Prenatal Labs: ABO, Rh: --/--/O POS (10/14 2059) Antibody: NEG (10/14 2059) Rubella: Immune (03/24 0000)  RPR: Non Reactive (10/14 2059)  HBsAg: Negative (03/24 0000)  HIV: Non-reactive (03/24 0000)  GTT: 134 Varicella: Immune GBS: Negative (09/19 0000)   Medical / Surgical History :  Past medical history:  Past Medical History:  Diagnosis Date  . Asthma    as needed medications, no recent flare ups  . Ectopic pregnancy 2014  . Iron deficiency      Past surgical history:  Past Surgical History:  Procedure Laterality Date  . LAPAROSCOPIC OOPHERECTOMY Left 2014    Family History:  Family History  Problem Relation Age of Onset  . Diabetes Maternal Grandmother   . Heart disease Maternal Grandmother      Social History:  reports that she has never smoked. She has never used smokeless tobacco. She reports that she does not drink alcohol or use drugs.   Allergies: Review of patient's allergies indicates no known allergies.    Current Medications at time of admission:  Prior to Admission medications   Medication Sig Start Date End Date Taking? Authorizing Provider  ferrous sulfate 325 (65 FE) MG tablet Take 325 mg by mouth daily with breakfast.    Historical Provider, MD  Prenatal Vit-Fe Fumarate-FA (PRENATAL VITAMIN PO) Take by mouth.    Historical Provider, MD     Review of Systems: Active FM Irregular BH ctxs No LOF  / SROM @ No bloody show    Physical Exam:  VS: Last menstrual period 11/07/2015.  General: alert and oriented, appears calm Heart:  RRR Lungs: Clear lung fields Abdomen: Gravid, soft and non-tender, non-distended / uterus: gravid, non-tender Extremities: no edema  Genitalia / VE:  3.5cm/70%/-2/BBOW with ctxs/vtx Assessment: [redacted] weeks gestation Elective induction stage of labor  Plan:  1. Admit to Birth Place for Elective Induction of Labor    - Routine labor and delivery orders    - Pitocin begin at 2 milliunits and increase by 2 mu    - Stadol 1mg  every 1 hour PRN 2. GBS Negative    - No prophylaxis indicated 3. Postpartum:      - Breast/Bottle    - Contraception: IUD or Nexplanon 4. Anticipate NSVD    - Proven pelvis 6#7oz  Dr. Feliberto GottronSchermerhorn notified of admission / plan of care  Kayla Rodriguez, CNM

## 2016-08-13 ENCOUNTER — Encounter: Payer: Self-pay | Admitting: *Deleted

## 2016-08-13 ENCOUNTER — Inpatient Hospital Stay
Admission: EM | Admit: 2016-08-13 | Discharge: 2016-08-15 | DRG: 775 | Disposition: A | Discharge status: Home / Self Care | Payer: Medicaid Other | Attending: Obstetrics and Gynecology | Admitting: Obstetrics and Gynecology

## 2016-08-13 DIAGNOSIS — J45909 Unspecified asthma, uncomplicated: Secondary | ICD-10-CM | POA: Diagnosis present

## 2016-08-13 DIAGNOSIS — Z833 Family history of diabetes mellitus: Secondary | ICD-10-CM | POA: Diagnosis not present

## 2016-08-13 DIAGNOSIS — K219 Gastro-esophageal reflux disease without esophagitis: Secondary | ICD-10-CM | POA: Diagnosis present

## 2016-08-13 DIAGNOSIS — O9952 Diseases of the respiratory system complicating childbirth: Secondary | ICD-10-CM | POA: Diagnosis present

## 2016-08-13 DIAGNOSIS — D649 Anemia, unspecified: Secondary | ICD-10-CM | POA: Diagnosis present

## 2016-08-13 DIAGNOSIS — Z3A40 40 weeks gestation of pregnancy: Secondary | ICD-10-CM | POA: Diagnosis not present

## 2016-08-13 DIAGNOSIS — O9902 Anemia complicating childbirth: Secondary | ICD-10-CM | POA: Diagnosis present

## 2016-08-13 DIAGNOSIS — Z87891 Personal history of nicotine dependence: Secondary | ICD-10-CM | POA: Diagnosis not present

## 2016-08-13 DIAGNOSIS — Z8249 Family history of ischemic heart disease and other diseases of the circulatory system: Secondary | ICD-10-CM

## 2016-08-13 DIAGNOSIS — O9962 Diseases of the digestive system complicating childbirth: Secondary | ICD-10-CM | POA: Diagnosis present

## 2016-08-13 DIAGNOSIS — O26893 Other specified pregnancy related conditions, third trimester: Secondary | ICD-10-CM | POA: Diagnosis present

## 2016-08-13 HISTORY — DX: Anemia, unspecified: D64.9

## 2016-08-13 LAB — CBC
HEMATOCRIT: 33.1 % — AB (ref 35.0–47.0)
HEMOGLOBIN: 11.4 g/dL — AB (ref 12.0–16.0)
MCH: 31.1 pg (ref 26.0–34.0)
MCHC: 34.3 g/dL (ref 32.0–36.0)
MCV: 90.6 fL (ref 80.0–100.0)
Platelets: 178 10*3/uL (ref 150–440)
RBC: 3.65 MIL/uL — ABNORMAL LOW (ref 3.80–5.20)
RDW: 15.6 % — ABNORMAL HIGH (ref 11.5–14.5)
WBC: 5.9 10*3/uL (ref 3.6–11.0)

## 2016-08-13 LAB — TYPE AND SCREEN
ABO/RH(D): O POS
ANTIBODY SCREEN: NEGATIVE

## 2016-08-13 LAB — CHLAMYDIA/NGC RT PCR (ARMC ONLY)
CHLAMYDIA TR: NOT DETECTED
N GONORRHOEAE: NOT DETECTED

## 2016-08-13 MED ORDER — OXYTOCIN BOLUS FROM INFUSION
500.0000 mL | Freq: Once | INTRAVENOUS | Status: AC
  Administered 2016-08-14: 500 mL via INTRAVENOUS

## 2016-08-13 MED ORDER — DINOPROSTONE 10 MG VA INST
10.0000 mg | VAGINAL_INSERT | Freq: Once | VAGINAL | Status: AC
Start: 2016-08-13 — End: 2016-08-13
  Administered 2016-08-13: 10 mg via VAGINAL
  Filled 2016-08-13: qty 1

## 2016-08-13 MED ORDER — LACTATED RINGERS IV SOLN
INTRAVENOUS | Status: DC
  Administered 2016-08-13 – 2016-08-15 (×3): via INTRAVENOUS

## 2016-08-13 MED ORDER — ZOLPIDEM TARTRATE 5 MG PO TABS
5.0000 mg | ORAL_TABLET | Freq: Once | ORAL | Status: AC | PRN
  Administered 2016-08-13: 5 mg via ORAL
  Filled 2016-08-13: qty 1

## 2016-08-13 MED ORDER — ONDANSETRON HCL 4 MG/2ML IJ SOLN
4.0000 mg | Freq: Four times a day (QID) | INTRAMUSCULAR | Status: DC | PRN
  Administered 2016-08-14: 4 mg via INTRAVENOUS
  Filled 2016-08-13: qty 2

## 2016-08-13 MED ORDER — TERBUTALINE SULFATE 1 MG/ML IJ SOLN
0.2500 mg | Freq: Once | INTRAMUSCULAR | Status: DC | PRN
  Filled 2016-08-13: qty 1

## 2016-08-13 MED ORDER — ACETAMINOPHEN 325 MG PO TABS: 650.0000 mg | ORAL_TABLET | ORAL | Status: DC | PRN

## 2016-08-13 MED ORDER — SOD CITRATE-CITRIC ACID 500-334 MG/5ML PO SOLN
30.0000 mL | ORAL | Status: DC | PRN
  Filled 2016-08-13: qty 30

## 2016-08-13 MED ORDER — LACTATED RINGERS IV SOLN: 500.0000 mL | INTRAVENOUS | Status: DC | PRN

## 2016-08-13 MED ORDER — OXYTOCIN 40 UNITS IN LACTATED RINGERS INFUSION - SIMPLE MED
2.5000 [IU]/h | INTRAVENOUS | Status: DC
  Administered 2016-08-14: 2.5 [IU]/h via INTRAVENOUS

## 2016-08-13 MED ORDER — LIDOCAINE HCL (PF) 1 % IJ SOLN: 30.0000 mL | INTRAMUSCULAR | Status: DC | PRN

## 2016-08-13 MED ORDER — MISOPROSTOL 200 MCG PO TABS
ORAL_TABLET | ORAL | Status: AC
  Filled 2016-08-13: qty 4

## 2016-08-13 MED ORDER — OXYTOCIN 40 UNITS IN LACTATED RINGERS INFUSION - SIMPLE MED
1.0000 m[IU]/min | INTRAVENOUS | Status: DC
  Filled 2016-08-13: qty 1000

## 2016-08-13 MED ORDER — AMMONIA AROMATIC IN INHA
RESPIRATORY_TRACT | Status: AC
  Filled 2016-08-13: qty 10

## 2016-08-13 MED ORDER — OXYTOCIN 10 UNIT/ML IJ SOLN
INTRAMUSCULAR | Status: AC
  Filled 2016-08-13: qty 2

## 2016-08-13 MED ORDER — BUTORPHANOL TARTRATE 1 MG/ML IJ SOLN
1.0000 mg | INTRAMUSCULAR | Status: DC | PRN
  Administered 2016-08-14 (×2): 1 mg via INTRAVENOUS
  Filled 2016-08-13 (×2): qty 1

## 2016-08-13 MED ORDER — LIDOCAINE HCL (PF) 1 % IJ SOLN
INTRAMUSCULAR | Status: DC
Start: 2016-08-13 — End: 2016-08-14
  Filled 2016-08-13: qty 30

## 2016-08-13 NOTE — H&P (Signed)
Kayla Rodriguez is a 25 y.o. female presenting for induction by MS for 40 weeks and the patients desire to be delivered . Marland Kitchen. OB History    Gravida Para Term Preterm AB Living   3 1 1   1 1    SAB TAB Ectopic Multiple Live Births   1       1     Past Medical History:  Diagnosis Date  . Anemia   . Asthma    as needed medications, no recent flare ups  . Ectopic pregnancy 2014  . Iron deficiency    Past Surgical History:  Procedure Laterality Date  . LAPAROSCOPIC OOPHERECTOMY Left 2014   Family History: family history includes Diabetes in her maternal grandmother; Heart disease in her maternal grandmother. Social History:  reports that she has never smoked. She has never used smokeless tobacco. She reports that she does not drink alcohol or use drugs.     Maternal Diabetes: No Genetic Screening: Normal Maternal Ultrasounds/Referrals: Normal Fetal Ultrasounds or other Referrals:  None Maternal Substance Abuse:  No Significant Maternal Medications:  None Significant Maternal Lab Results:  None Other Comments:  None  ROS History Dilation: 2 Effacement (%): 70 Exam by:: Dr. Feliberto GottronSchermerhorn Blood pressure 119/71, pulse (!) 59, temperature 98.2 F (36.8 C), temperature source Oral, resp. rate 18, height 5\' 2"  (1.575 m), weight 152 lb 3.2 oz (69 kg), last menstrual period 11/07/2015. Exam  cx 2 cm/ 70 / vtx Physical Exam  Lungs CTA  CV RRR   Prenatal labs: ABO, Rh: --/--/O POS (10/24 1752) Antibody: NEG (10/24 1752) Rubella: Immune (03/24 0000) RPR: Non Reactive (10/14 2059)  HBsAg: Negative (03/24 0000)  HIV: Non-reactive (03/24 0000)  GBS: Negative (09/19 0000)   Assessment/Plan: Induction , social  cerrvidil tonight    Kayla Rodriguez 08/13/2016, 8:05 PM

## 2016-08-13 NOTE — Progress Notes (Signed)
Chaplain at bedside to discuss advanced directives with patient.

## 2016-08-13 NOTE — Progress Notes (Signed)
CH responded to an OR for an AD in room LDR6. Pt was alert and in good spirits. Pt and family were educated on the AD. CH left the materials with Pt for further review. CH provided a "Happy Birthday" prayer for the upcoming birth of her child. CH is available for follow up as needed.    08/13/16 1900  Clinical Encounter Type  Visited With Patient;Patient and family together;Health care provider  Visit Type Initial;Spiritual support;Other (Comment) (AD Education)  Referral From Nurse  Spiritual Encounters  Spiritual Needs Literature;Prayer

## 2016-08-14 ENCOUNTER — Inpatient Hospital Stay: Payer: Medicaid Other | Admitting: Certified Registered"

## 2016-08-14 LAB — RPR: RPR Ser Ql: NONREACTIVE

## 2016-08-14 MED ORDER — FENTANYL 2.5 MCG/ML W/ROPIVACAINE 0.2% IN NS 100 ML EPIDURAL INFUSION (ARMC-ANES)
EPIDURAL | Status: AC
  Filled 2016-08-14: qty 100

## 2016-08-14 MED ORDER — FLEET ENEMA 7-19 GM/118ML RE ENEM: 1.0000 | ENEMA | Freq: Every day | RECTAL | Status: DC | PRN

## 2016-08-14 MED ORDER — DIPHENHYDRAMINE HCL 25 MG PO CAPS: 25.0000 mg | ORAL_CAPSULE | Freq: Four times a day (QID) | ORAL | Status: DC | PRN

## 2016-08-14 MED ORDER — SIMETHICONE 80 MG PO CHEW: 80.0000 mg | CHEWABLE_TABLET | ORAL | Status: DC | PRN

## 2016-08-14 MED ORDER — IBUPROFEN 600 MG PO TABS
600.0000 mg | ORAL_TABLET | Freq: Four times a day (QID) | ORAL | Status: DC
  Administered 2016-08-14 – 2016-08-15 (×5): 600 mg via ORAL
  Filled 2016-08-14 (×5): qty 1

## 2016-08-14 MED ORDER — PRENATAL MULTIVITAMIN CH
1.0000 | ORAL_TABLET | Freq: Every day | ORAL | Status: DC
  Administered 2016-08-14 – 2016-08-15 (×2): 1 via ORAL
  Filled 2016-08-14 (×2): qty 1

## 2016-08-14 MED ORDER — IBUPROFEN 600 MG PO TABS
ORAL_TABLET | ORAL | Status: AC
  Filled 2016-08-14: qty 1

## 2016-08-14 MED ORDER — SODIUM CHLORIDE 0.9% FLUSH: 3.0000 mL | Freq: Two times a day (BID) | INTRAVENOUS | Status: DC

## 2016-08-14 MED ORDER — WITCH HAZEL-GLYCERIN EX PADS: 1.0000 "application " | MEDICATED_PAD | CUTANEOUS | Status: DC | PRN

## 2016-08-14 MED ORDER — LIDOCAINE-EPINEPHRINE (PF) 1.5 %-1:200000 IJ SOLN
INTRAMUSCULAR | Status: DC | PRN
  Administered 2016-08-14: 3 mL via EPIDURAL

## 2016-08-14 MED ORDER — SENNOSIDES-DOCUSATE SODIUM 8.6-50 MG PO TABS: 2.0000 | ORAL_TABLET | ORAL | Status: DC

## 2016-08-14 MED ORDER — HYDROCODONE-ACETAMINOPHEN 5-325 MG PO TABS: 1.0000 | ORAL_TABLET | Freq: Four times a day (QID) | ORAL | Status: DC | PRN

## 2016-08-14 MED ORDER — TETANUS-DIPHTH-ACELL PERTUSSIS 5-2.5-18.5 LF-MCG/0.5 IM SUSP: 0.5000 mL | Freq: Once | INTRAMUSCULAR | Status: DC

## 2016-08-14 MED ORDER — BENZOCAINE-MENTHOL 20-0.5 % EX AERO: 1.0000 "application " | INHALATION_SPRAY | CUTANEOUS | Status: DC | PRN

## 2016-08-14 MED ORDER — COCONUT OIL OIL
1.0000 | TOPICAL_OIL | Status: DC | PRN
Start: 2016-08-14 — End: 2016-08-15

## 2016-08-14 MED ORDER — BISACODYL 10 MG RE SUPP: 10.0000 mg | Freq: Every day | RECTAL | Status: DC | PRN

## 2016-08-14 MED ORDER — SODIUM CHLORIDE 0.9% FLUSH: 3.0000 mL | INTRAVENOUS | Status: DC | PRN

## 2016-08-14 MED ORDER — MEASLES, MUMPS & RUBELLA VAC ~~LOC~~ INJ
0.5000 mL | INJECTION | Freq: Once | SUBCUTANEOUS | Status: DC
  Filled 2016-08-14: qty 0.5

## 2016-08-14 MED ORDER — ACETAMINOPHEN 325 MG PO TABS
650.0000 mg | ORAL_TABLET | ORAL | Status: DC | PRN
Start: 2016-08-14 — End: 2016-08-15

## 2016-08-14 MED ORDER — METHYLERGONOVINE MALEATE 0.2 MG PO TABS
0.2000 mg | ORAL_TABLET | Freq: Three times a day (TID) | ORAL | Status: AC
  Administered 2016-08-14 – 2016-08-15 (×4): 0.2 mg via ORAL
  Filled 2016-08-14 (×4): qty 1

## 2016-08-14 MED ORDER — SODIUM CHLORIDE 0.9 % IV SOLN: 250.0000 mL | INTRAVENOUS | Status: DC | PRN

## 2016-08-14 MED ORDER — DIBUCAINE 1 % RE OINT: 1.0000 "application " | TOPICAL_OINTMENT | RECTAL | Status: DC | PRN

## 2016-08-14 MED ORDER — ONDANSETRON HCL 4 MG/2ML IJ SOLN: 4.0000 mg | INTRAMUSCULAR | Status: DC | PRN

## 2016-08-14 MED ORDER — LIDOCAINE HCL (PF) 1 % IJ SOLN
INTRAMUSCULAR | Status: DC | PRN
  Administered 2016-08-14: 3 mL via SUBCUTANEOUS

## 2016-08-14 MED ORDER — BUPIVACAINE HCL (PF) 0.25 % IJ SOLN
INTRAMUSCULAR | Status: DC | PRN
  Administered 2016-08-14: 5 mL via EPIDURAL

## 2016-08-14 MED ORDER — ONDANSETRON HCL 4 MG PO TABS: 4.0000 mg | ORAL_TABLET | ORAL | Status: DC | PRN

## 2016-08-14 MED ORDER — ZOLPIDEM TARTRATE 5 MG PO TABS: 5.0000 mg | ORAL_TABLET | Freq: Every evening | ORAL | Status: DC | PRN

## 2016-08-14 MED ORDER — FENTANYL 2.5 MCG/ML W/ROPIVACAINE 0.2% IN NS 100 ML EPIDURAL INFUSION (ARMC-ANES)
EPIDURAL | Status: DC | PRN
Start: 2016-08-14 — End: 2016-08-14
  Administered 2016-08-14: 9 mL/h via EPIDURAL

## 2016-08-14 NOTE — Progress Notes (Signed)
S: Uncomfort with UC's,. + CTX, no LOF, VB O: Vitals:   08/14/16 0411 08/14/16 0545 08/14/16 0611 08/14/16 0711  BP: 125/81 113/63 106/60 116/67  Pulse: 76 (!) 107 90 74  Resp:    17  Temp:    98.2 F (36.8 C)  TempSrc:    Oral  Weight:      Height:        Gen: NAD, AAOx3      Abd: FNTTP      Ext: Non-tender, Nonedmeatous    FHT: + mod var + accelerations, Tiny variable decelerations TOCO: Q 1.5  min SVE: 4-5/90/vtx-2   A/P:  25 y.o. yo G3P1011 at 3012w1d for IOL  Labor: Cervidil out and labor progressing  FWB: Reassuring Cat 2 tracing.  GBS:neg  AROM at 0858a for blood tinged clear fluid  P: Epidural now  Consider starting Pitocin but, will wait since pt is tachysystole   Sharee Pimplearon W Jacoria Keiffer 9:00 AM

## 2016-08-14 NOTE — Progress Notes (Signed)
Called by RN to discuss getting "up to BR and voiding with full bladder and pt passed some clots", Fundus has remained firm and lochia mod. Will give Metherine 0.2 mg po tid to control bleeding.

## 2016-08-14 NOTE — Progress Notes (Signed)
Continue with cervidil as long as FHR remains reassuring per Dr. Feliberto GottronSchermerhorn.

## 2016-08-14 NOTE — Anesthesia Procedure Notes (Signed)
Epidural Patient location during procedure: OB  Staffing Anesthesiologist: Yves DillARROLL, PAUL Resident/CRNA: Mathews ArgyleLOGAN, Tarika Mckethan Performed: resident/CRNA   Preanesthetic Checklist Completed: patient identified, site marked, surgical consent, pre-op evaluation, timeout performed, IV checked, risks and benefits discussed and monitors and equipment checked  Epidural Patient position: sitting Prep: Betadine and site prepped and draped Patient monitoring: heart rate, continuous pulse ox and blood pressure Approach: midline Location: L4-L5 Injection technique: LOR saline  Needle:  Needle type: Tuohy  Needle gauge: 18 G Needle length: 9 cm Needle insertion depth: 4.5 cm Catheter type: closed end flexible Catheter size: 20 Guage Catheter at skin depth: 9 cm Test dose: negative and 1.5% lidocaine with Epi 1:200 K  Assessment Events: blood not aspirated, injection not painful, no injection resistance, negative IV test and no paresthesia  Additional Notes   Patient tolerated the insertion well without complications.Reason for block:procedure for pain

## 2016-08-14 NOTE — Anesthesia Preprocedure Evaluation (Signed)
Anesthesia Evaluation  Patient identified by MRN, date of birth, ID band Patient awake    Reviewed: H&P , NPO status , Patient's Chart, lab work & pertinent test results  Airway Mallampati: II  TM Distance: >3 FB Neck ROM: full    Dental  (+) Dental Advidsory Given   Pulmonary neg pulmonary ROS, former smoker,    Pulmonary exam normal        Cardiovascular negative cardio ROS Normal cardiovascular exam     Neuro/Psych negative neurological ROS  negative psych ROS   GI/Hepatic Neg liver ROS, GERD  ,  Endo/Other  negative endocrine ROS  Renal/GU negative Renal ROS  negative genitourinary   Musculoskeletal   Abdominal   Peds  Hematology  (+) anemia ,   Anesthesia Other Findings   Reproductive/Obstetrics (+) Pregnancy                             Anesthesia Physical Anesthesia Plan  ASA: II  Anesthesia Plan: Epidural   Post-op Pain Management:    Induction:   Airway Management Planned:   Additional Equipment:   Intra-op Plan:   Post-operative Plan:   Informed Consent: I have reviewed the patients History and Physical, chart, labs and discussed the procedure including the risks, benefits and alternatives for the proposed anesthesia with the patient or authorized representative who has indicated his/her understanding and acceptance.     Plan Discussed with: CRNA  Anesthesia Plan Comments:         Anesthesia Quick Evaluation

## 2016-08-15 ENCOUNTER — Encounter: Payer: Self-pay | Admitting: Emergency Medicine

## 2016-08-15 LAB — CBC
HCT: 29.5 % — ABNORMAL LOW (ref 35.0–47.0)
Hemoglobin: 10 g/dL — ABNORMAL LOW (ref 12.0–16.0)
MCH: 30.7 pg (ref 26.0–34.0)
MCHC: 33.8 g/dL (ref 32.0–36.0)
MCV: 90.7 fL (ref 80.0–100.0)
PLATELETS: 163 10*3/uL (ref 150–440)
RBC: 3.25 MIL/uL — AB (ref 3.80–5.20)
RDW: 15.5 % — AB (ref 11.5–14.5)
WBC: 13.2 10*3/uL — ABNORMAL HIGH (ref 3.6–11.0)

## 2016-08-15 NOTE — Discharge Instructions (Signed)
Vaginal Delivery, Care After Refer to this sheet in the next few weeks. These discharge instructions provide you with information on caring for yourself after delivery. Your health care provider may also give you specific instructions. Your treatment has been planned according to the most current medical practices available, but problems sometimes occur. Call your health care provider if you have any problems or questions after you go home. HOME CARE INSTRUCTIONS  Take over-the-counter or prescription medicines only as directed by your health care provider or pharmacist.  Do not drink alcohol, especially if you are breastfeeding or taking medicine to relieve pain.  Do not chew or smoke tobacco.  Do not use illegal drugs.  Continue to use good perineal care. Good perineal care includes:  Wiping your perineum from front to back.  Keeping your perineum clean.  Do not use tampons or douche until your health care provider says it is okay.  Shower, wash your hair, and take tub baths as directed by your health care provider.  Wear a well-fitting bra that provides breast support.  Eat healthy foods.  Drink enough fluids to keep your urine clear or pale yellow.  Eat high-fiber foods such as whole grain cereals and breads, brown rice, beans, and fresh fruits and vegetables every day. These foods may help prevent or relieve constipation.  Follow your health care provider's recommendations regarding resumption of activities such as climbing stairs, driving, lifting, exercising, or traveling.  Talk to your health care provider about resuming sexual activities. Resumption of sexual activities is dependent upon your risk of infection, your rate of healing, and your comfort and desire to resume sexual activity.  Try to have someone help you with your household activities and your newborn for at least a few days after you leave the hospital.  Rest as much as possible. Try to rest or take a nap  when your newborn is sleeping.  Increase your activities gradually.  Keep all of your scheduled postpartum appointments. It is very important to keep your scheduled follow-up appointments. At these appointments, your health care provider will be checking to make sure that you are healing physically and emotionally. SEEK MEDICAL CARE IF:   You are passing large clots from your vagina. Save any clots to show your health care provider.  You have a foul smelling discharge from your vagina.  You have trouble urinating.  You are urinating frequently.  You have pain when you urinate.  You have a change in your bowel movements.  You have increasing redness, pain, or swelling near your vaginal incision (episiotomy) or vaginal tear.  You have pus draining from your episiotomy or vaginal tear.  Your episiotomy or vaginal tear is separating.  You have painful, hard, or reddened breasts.  You have a severe headache.  You have blurred vision or see spots.  You feel sad or depressed.  You have thoughts of hurting yourself or your newborn.  You have questions about your care, the care of your newborn, or medicines.  You are dizzy or light-headed.  You have a rash.  You have nausea or vomiting.  You were breastfeeding and have not had a menstrual period within 12 weeks after you stopped breastfeeding.  You are not breastfeeding and have not had a menstrual period by the 12th week after delivery.  You have a fever. SEEK IMMEDIATE MEDICAL CARE IF:   You have persistent pain.  You have chest pain.  You have shortness of breath.  You faint.  You  have leg pain.  You have stomach pain.  Your vaginal bleeding saturates two or more sanitary pads in 1 hour.   This information is not intended to replace advice given to you by your health care provider. Make sure you discuss any questions you have with your health care provider.   Document Released: 10/04/2000 Document Revised:  06/28/2015 Document Reviewed: 06/03/2012 Elsevier Interactive Patient Education 2016 [employer]. Discharge instructions:   Call office if you have any of the following: headache, visual changes, fever >101.0 F, chills, breast concerns, excessive vaginal bleeding, incision drainage or problems, leg pain or redness, depression or any other concerns.   Activity: Do not lift > 10 lbs for 6 weeks.  No intercourse or tampons for 6 weeks.  No driving for 1-2 weeks.   Call your doctor for increased pain or vaginal bleeding, temperature above 101.0, depression, or concerns.  No strenuous activity or heavy lifting for 6 weeks.  No intercourse, tampons, douching, or enemas for 6 weeks.  No tub baths-showers only.  No driving for 2 weeks or while taking pain medications.  Continue prenatal vitamin and iron.  Increase calories and fluids while breastfeeding.  You may have a slight fever when your milk comes in, but it should go away on its own.  If it does not, and rises above 101.0 please call the doctor.  For concerns about your baby, please call your pediatrician For breastfeeding concerns, the lactation consultant can be reached at [phone]

## 2016-08-15 NOTE — Discharge Summary (Signed)
Obstetrical Discharge Summary  Patient Name: Kayla Rodriguez DOB: [birth year] MRN: [id number]  Date of Admission: 08/13/2016 Date of Discharge: 08/15/2016  [facility]  Gestational Age at Delivery: [id number]   Antepartum complications: anemia Admitting Diagnosis: induction of labor - elective Secondary Diagnosis: Patient Active Problem List   Diagnosis Date Noted  . Labor and delivery, indication for care 08/13/2016  . Constipation 08/09/2016  . Uterine contractions during pregnancy 07/23/2016  . Premature uterine contractions 07/21/2016  . Indication for care in labor and delivery, antepartum 07/04/2016    Augmentation: AROM and cervidil Complications: None Intrapartum complications/course: patient IOL with cervidil and AROM, progressed to complete and short second stage.   Date of Delivery: 08/14/16 Delivered By: Milon Scorearon Jones Delivery Type: spontaneous vaginal delivery Anesthesia: epidural Placenta: sponatneous Laceration: none Episiotomy: none Newborn Data: Live born female  Birth Weight: 8 lb 1.1 oz (3660 g) APGAR: 8, 9    Discharge Physical Exam:  BP 121/69   Pulse 63   Temp 98.1 F (36.7 C) (Oral)   Resp 18   Ht 5\' 2"  (1.575 m)   Wt 69 kg (152 lb 3.2 oz)   LMP 11/07/2015   SpO2 98%   BMI 27.84 kg/m   General: NAD CV: RRR Pulm: CTABL, nl effort ABD: s/nd/nt, fundus firm and below the umbilicus Lochia: moderate DVT Evaluation: LE non-ttp, no evidence of DVT on exam.  Hemoglobin  Date Value Ref Range Status  08/15/2016 10.0 (L) 12.0 - 16.0 g/dL Final   HGB  Date Value Ref Range Status  07/05/2014 11.4 (L) 12.0 - 16.0 g/dL Final   HCT  Date Value Ref Range Status  08/15/2016 29.5 (L) 35.0 - 47.0 % Final  07/05/2014 35.8 35.0 - 47.0 % Final    Post partum course: routine Postpartum Procedures: none Disposition: stable, discharge to home. Baby Feeding: formula Baby Disposition: home with mom  Rh Immune globulin given:  no Rubella vaccine given:  no Tdap vaccine given in AP or PP setting: AP Flu vaccine given in AP or PP setting: ordered PP  Contraception: desires IUD or Nexplanon LARC, depo at discharge  Prenatal Labs:   ABO, Rh: --/--/O POS (10/14 2059) Antibody: NEG (10/14 2059) Rubella: Immune (03/24 0000)  RPR: Non Reactive (10/14 2059)  HBsAg: Negative (03/24 0000)  HIV: Non-reactive (03/24 0000)  GTT: 134 Varicella: Immune GBS: Negative (09/19 0000)    Plan:  Kayla Rodriguez was discharged to home in good condition. [facility] weeks   Discharge Medications:   Medication List    TAKE these medications   ferrous sulfate 325 (65 FE) MG tablet Take 325 mg by mouth daily with breakfast.   PRENATAL VITAMIN PO Take by mouth.       Follow-up Information    Sharee Pimplearon W Jones, CNM Follow up in 6 week(s).   Specialty:  Obstetrics and Gynecology Contact information: [address] [facility] [place] [place] [id number] [phone]           Signed: ----- Ranae Plumberhelsea Yoandri Congrove, MD Attending Obstetrician and Gynecologist [facility], Department of OB/GYN [facility]

## 2016-08-15 NOTE — Progress Notes (Signed)
Mom was preoccupied during the review of all discharge instructions when asked if RN should return to review instructions she stated that she was listening. Patient verbalized understanding of all discharge instructions and the need to make follow up appointments. Patient discharge via wheelchair with RN.

## 2016-08-15 NOTE — Anesthesia Postprocedure Evaluation (Addendum)
Anesthesia Post Note  Patient: Kayla Rodriguez  Procedure(s) Performed: * No procedures listed *  Patient location during evaluation: Mother Baby Anesthesia Type: Epidural Level of consciousness: awake, awake and alert and oriented Pain management: pain level controlled Vital Signs Assessment: post-procedure vital signs reviewed and stable Respiratory status: spontaneous breathing, nonlabored ventilation and respiratory function stable Cardiovascular status: stable Anesthetic complications: no    Last Vitals:  Vitals:   08/15/16 0441 08/15/16 0725  BP: 116/72 121/69  Pulse: 66 63  Resp: 16 18  Temp: 36.7 C 36.7 C    Last Pain:  Vitals:   08/15/16 0725  TempSrc: Oral  PainSc:                  Casey Burkitthuy Xavier Fournier

## 2016-12-20 ENCOUNTER — Encounter: Payer: Self-pay | Admitting: Emergency Medicine

## 2016-12-20 ENCOUNTER — Emergency Department: Payer: Medicaid Other

## 2016-12-20 ENCOUNTER — Emergency Department
Admission: EM | Admit: 2016-12-20 | Discharge: 2016-12-20 | Disposition: A | Discharge status: Home / Self Care | Payer: Medicaid Other | Attending: Emergency Medicine | Admitting: Emergency Medicine

## 2016-12-20 DIAGNOSIS — R102 Pelvic and perineal pain unspecified side: Secondary | ICD-10-CM

## 2016-12-20 DIAGNOSIS — N83202 Unspecified ovarian cyst, left side: Secondary | ICD-10-CM

## 2016-12-20 DIAGNOSIS — N730 Acute parametritis and pelvic cellulitis: Secondary | ICD-10-CM

## 2016-12-20 DIAGNOSIS — N739 Female pelvic inflammatory disease, unspecified: Secondary | ICD-10-CM | POA: Insufficient documentation

## 2016-12-20 DIAGNOSIS — J45909 Unspecified asthma, uncomplicated: Secondary | ICD-10-CM | POA: Insufficient documentation

## 2016-12-20 LAB — WET PREP, GENITAL
CLUE CELLS WET PREP: NONE SEEN
SPERM: NONE SEEN
TRICH WET PREP: NONE SEEN
YEAST WET PREP: NONE SEEN

## 2016-12-20 LAB — COMPREHENSIVE METABOLIC PANEL
ALT: 20 U/L (ref 14–54)
AST: 20 U/L (ref 15–41)
Albumin: 4.2 g/dL (ref 3.5–5.0)
Alkaline Phosphatase: 47 U/L (ref 38–126)
Anion gap: 6 (ref 5–15)
BUN: 11 mg/dL (ref 6–20)
CHLORIDE: 106 mmol/L (ref 101–111)
CO2: 25 mmol/L (ref 22–32)
Calcium: 9.4 mg/dL (ref 8.9–10.3)
Creatinine, Ser: 0.45 mg/dL (ref 0.44–1.00)
GFR calc Af Amer: >60 mL/min (ref >60)
Glucose, Bld: 93 mg/dL (ref 65–99)
Potassium: 3.8 mmol/L (ref 3.5–5.1)
Sodium: 137 mmol/L (ref 135–145)
Total Bilirubin: 0.5 mg/dL (ref 0.3–1.2)
Total Protein: 7.9 g/dL (ref 6.5–8.1)

## 2016-12-20 LAB — CHLAMYDIA/NGC RT PCR (ARMC ONLY)
Chlamydia Tr: NOT DETECTED
N gonorrhoeae: NOT DETECTED

## 2016-12-20 LAB — CBC
HCT: 37.4 % (ref 35.0–47.0)
Hemoglobin: 12.3 g/dL (ref 12.0–16.0)
MCH: 28.9 pg (ref 26.0–34.0)
MCHC: 32.8 g/dL (ref 32.0–36.0)
MCV: 87.9 fL (ref 80.0–100.0)
Platelets: 263 10*3/uL (ref 150–440)
RBC: 4.26 MIL/uL (ref 3.80–5.20)
RDW: 14.4 % (ref 11.5–14.5)
WBC: 7.3 10*3/uL (ref 3.6–11.0)

## 2016-12-20 LAB — URINALYSIS, COMPLETE (UACMP) WITH MICROSCOPIC
Bacteria, UA: NONE SEEN
Bilirubin Urine: NEGATIVE
Glucose, UA: NEGATIVE mg/dL
Hgb urine dipstick: NEGATIVE
Ketones, ur: NEGATIVE mg/dL
LEUKOCYTES UA: NEGATIVE
Nitrite: NEGATIVE
PH: 6 (ref 5.0–8.0)
Protein, ur: NEGATIVE mg/dL
SPECIFIC GRAVITY, URINE: 1.024 (ref 1.005–1.030)

## 2016-12-20 LAB — RAPID HIV SCREEN (HIV 1/2 AB+AG)
HIV 1/2 Antibodies: NONREACTIVE
HIV-1 P24 Antigen - HIV24: NONREACTIVE

## 2016-12-20 LAB — POCT PREGNANCY, URINE: Preg Test, Ur: NEGATIVE

## 2016-12-20 LAB — LIPASE, BLOOD

## 2016-12-20 MED ORDER — DOXYCYCLINE HYCLATE 100 MG PO CAPS: 100.0000 mg | ORAL_CAPSULE | Freq: Two times a day (BID) | ORAL | 8 refills | Status: DC

## 2016-12-20 MED ORDER — LIDOCAINE HCL (PF) 1 % IJ SOLN
5.0000 mL | Freq: Once | INTRAMUSCULAR | Status: AC
  Administered 2016-12-20: 5 mL

## 2016-12-20 MED ORDER — LIDOCAINE HCL (PF) 1 % IJ SOLN
INTRAMUSCULAR | Status: AC
  Administered 2016-12-20: 5 mL
  Filled 2016-12-20: qty 5

## 2016-12-20 MED ORDER — CEFTRIAXONE SODIUM 250 MG IJ SOLR
250.0000 mg | Freq: Once | INTRAMUSCULAR | Status: AC
  Administered 2016-12-20: 250 mg via INTRAMUSCULAR
  Filled 2016-12-20: qty 250

## 2016-12-20 NOTE — ED Triage Notes (Signed)
Pt c/o left side/LLQ pain since last night. Ambulatory to triage. Has had NVD. NAD. Denies fevers. Skin warm and dry. Reports foul smell and vaginal discharge

## 2016-12-20 NOTE — ED Provider Notes (Signed)
[facility] Emergency Department Provider Note   ____________________________________________   I have reviewed the triage vital signs and the nursing notes.   HISTORY  Chief Complaint Abdominal Pain   History limited by: Not Limited   HPI Kayla Rodriguez is a 26 y.o. female who presents to the emergency department today because of concerns for left sided abdominal pain. Patient states the pain started last night. She states it sort of feels like contractions. Located in the left lower abdomen with some radiation to her back. The patient has noticed a foul odor to her urine and some dysuria. She has a history of kidney infections in the past and feels like these symptoms are similar to what she has had in the past. In addition the patient has been having some foul smelling vaginal discharge. No fevers.   Past Medical History:  Diagnosis Date  . Anemia   . Asthma    as needed medications, no recent flare ups  . Ectopic pregnancy 2014  . Iron deficiency     Patient Active Problem List   Diagnosis Date Noted  . Labor and delivery, indication for care 08/13/2016  . Constipation 08/09/2016  . Uterine contractions during pregnancy 07/23/2016  . Premature uterine contractions 07/21/2016  . Indication for care in labor and delivery, antepartum 07/04/2016    Past Surgical History:  Procedure Laterality Date  . LAPAROSCOPIC OOPHERECTOMY Left 2014    Prior to Admission medications   Medication Sig Start Date End Date Taking? Authorizing Provider  ferrous sulfate 325 (65 FE) MG tablet Take 325 mg by mouth daily with breakfast.    Historical Provider, MD  Prenatal Vit-Fe Fumarate-FA (PRENATAL VITAMIN PO) Take by mouth.    Historical Provider, MD    Allergies Patient has no known allergies.  Family History  Problem Relation Age of Onset  . Diabetes Maternal Grandmother   . Heart disease Maternal Grandmother     Social History Social History   Substance Use Topics  . Smoking status: Never Smoker  . Smokeless tobacco: Never Used  . Alcohol use No    Review of Systems  Constitutional: Negative for fever. Cardiovascular: Negative for chest pain. Respiratory: Negative for shortness of breath. Gastrointestinal: Positive for left lower quadrant pain. Genitourinary: Positive for dysuria. Musculoskeletal: Positive for back pain. Skin: Negative for rash. Neurological: Negative for headaches, focal weakness or numbness.  10-point ROS otherwise negative.  ____________________________________________   PHYSICAL EXAM:  VITAL SIGNS: ED Triage Vitals  Enc Vitals Group     BP 12/20/16 1516 104/71     Pulse Rate 12/20/16 1516 80     Resp 12/20/16 1516 16     Temp 12/20/16 1515 97.9 F (36.6 C)     Temp Source 12/20/16 1515 Oral     SpO2 12/20/16 1516 98 %     Weight 12/20/16 1516 132 lb (59.9 kg)     Height 12/20/16 1516 5\' 2"  (1.575 m)     Head Circumference --      Peak Flow --      Pain Score 12/20/16 1517 7     Pain Loc --      Pain Edu? --      Excl. in GC? --      Constitutional: Alert and oriented. Well appearing and in no distress. Eyes: Conjunctivae are normal. Normal extraocular movements. ENT   Head: Normocephalic and atraumatic.   Nose: No congestion/rhinnorhea.   Mouth/Throat: Mucous membranes are moist.   Neck: No  stridor. Hematological/Lymphatic/Immunilogical: No cervical lymphadenopathy. Cardiovascular: Normal rate, regular rhythm.  No murmurs, rubs, or gallops. Respiratory: Normal respiratory effort without tachypnea nor retractions. Breath sounds are clear and equal bilaterally. No wheezes/rales/rhonchi. Gastrointestinal: Soft and minimally tender in the left lower quadrant. Mild left sided cva tenderness.  No rebound. No guarding.  Genitourinary: No external lesions. No blood in vaginal vault. Positive CMT. No adnexal fullness. Some tenderness to palpation of left adnexa.   Musculoskeletal: Normal range of motion in all extremities. No lower extremity edema. Neurologic:  Normal speech and language. No gross focal neurologic deficits are appreciated.  Skin:  Skin is warm, dry and intact. No rash noted. Psychiatric: Mood and affect are normal. Speech and behavior are normal. Patient exhibits appropriate insight and judgment.  ____________________________________________    LABS (pertinent positives/negatives)  Labs Reviewed  WET PREP, GENITAL - Abnormal; Notable for the following:       Result Value   WBC, Wet Prep HPF POC FEW (*)    All other components within normal limits  LIPASE, BLOOD - Abnormal; Notable for the following:    Lipase <10 (*)    All other components within normal limits  URINALYSIS, COMPLETE (UACMP) WITH MICROSCOPIC - Abnormal; Notable for the following:    Color, Urine YELLOW (*)    APPearance HAZY (*)    Squamous Epithelial / LPF 6-30 (*)    All other components within normal limits  CHLAMYDIA/NGC RT PCR ([facility] ONLY)  COMPREHENSIVE METABOLIC PANEL  CBC  RAPID HIV SCREEN (HIV 1/2 AB+AG)  POC URINE PREG, ED  POCT PREGNANCY, URINE     ____________________________________________   EKG  None  ____________________________________________    RADIOLOGY  US IMPRESSION: 1. 1.6 cm mildly complex left ovarian cyst, likely a normal physiologic cyst. Associated small volume free physiologic fluid within the pelvis. 2. No other acute abnormality within the pelvis.  ____________________________________________   PROCEDURES  Procedures  ____________________________________________   INITIAL IMPRESSION / ASSESSMENT AND PLAN / ED COURSE  Pertinent labs & imaging results that were available during my care of the patient were reviewed by me and considered in my medical decision making (see chart for details).  Patient presented to the emergency department today with concerns for abdominal pain. Patient's UA  without findings consistent with infection. On pelvic exam patient did have some cervical motion tenderness. Given this finding patient was treated for PID. Additionally since she had some tenderness over the left adnexa and ultrasound was obtained to evaluate ovaries and possible tubo-ovarian abscess. This did not show tubo-ovarian abscess at did raise concerns for ovarian cyst. Discussed these findings with the patient. Will plan on discharging with doxycycline.  ____________________________________________   FINAL CLINICAL IMPRESSION(S) / ED DIAGNOSES  Final diagnoses:  Adnexal pain  Cyst of left ovary  PID (acute pelvic inflammatory disease)     Note: This dictation was prepared with Dragon dictation. Any transcriptional errors that result from this process are unintentional     Phineas Semen, MD 12/20/16 1755

## 2016-12-20 NOTE — ED Notes (Signed)
Patient transported to Ultrasound 

## 2016-12-20 NOTE — Discharge Instructions (Signed)
Please seek medical attention for any high fevers, chest pain, shortness of breath, change in behavior, persistent vomiting, bloody stool or any other new or concerning symptoms.  

## 2017-04-07 ENCOUNTER — Encounter: Payer: Self-pay | Admitting: Emergency Medicine

## 2017-04-07 ENCOUNTER — Emergency Department
Admission: EM | Admit: 2017-04-07 | Discharge: 2017-04-07 | Disposition: A | Discharge status: Home / Self Care | Payer: Medicaid Other | Attending: Emergency Medicine | Admitting: Emergency Medicine

## 2017-04-07 DIAGNOSIS — J45909 Unspecified asthma, uncomplicated: Secondary | ICD-10-CM | POA: Diagnosis not present

## 2017-04-07 DIAGNOSIS — Z3201 Encounter for pregnancy test, result positive: Secondary | ICD-10-CM | POA: Diagnosis not present

## 2017-04-07 DIAGNOSIS — Z3A01 Less than 8 weeks gestation of pregnancy: Secondary | ICD-10-CM | POA: Diagnosis not present

## 2017-04-07 DIAGNOSIS — R110 Nausea: Secondary | ICD-10-CM | POA: Diagnosis present

## 2017-04-07 LAB — URINALYSIS, COMPLETE (UACMP) WITH MICROSCOPIC
BACTERIA UA: NONE SEEN
Bilirubin Urine: NEGATIVE
Glucose, UA: NEGATIVE mg/dL
HGB URINE DIPSTICK: NEGATIVE
Ketones, ur: NEGATIVE mg/dL
Leukocytes, UA: NEGATIVE
NITRITE: NEGATIVE
PROTEIN: NEGATIVE mg/dL
RBC / HPF: NONE SEEN RBC/hpf (ref 0–5)
SPECIFIC GRAVITY, URINE: 1.021 (ref 1.005–1.030)
pH: 7 (ref 5.0–8.0)

## 2017-04-07 LAB — POCT PREGNANCY, URINE: PREG TEST UR: POSITIVE — AB

## 2017-04-07 LAB — HCG, QUANTITATIVE, PREGNANCY: hCG, Beta Chain, Quant, S: 185 m[IU]/mL — ABNORMAL HIGH (ref <5)

## 2017-04-07 NOTE — ED Provider Notes (Signed)
[facility] Emergency Department Provider Note  ____________________________________________  Time seen: Approximately 7:37 PM  I have reviewed the triage vital signs and the nursing notes.   HISTORY  Chief Complaint Possible Pregnancy    HPI Kayla Rodriguez is a 26 y.o. female presenting to the emergency department for concern for pregnancy. Patient states that she is approximately 4 weeks late. She has had some nausea. Patient has a 7651-month-old child at home. Patient presents to the emergency department to discuss "her options". Patient is concerned as her current boyfriend has doubts regarding paternity status. Patient denies abdominal pain and vaginal bleeding.   Past Medical History:  Diagnosis Date  . Anemia   . Asthma    as needed medications, no recent flare ups  . Ectopic pregnancy 2014  . Iron deficiency     Patient Active Problem List   Diagnosis Date Noted  . Labor and delivery, indication for care 08/13/2016  . Constipation 08/09/2016  . Uterine contractions during pregnancy 07/23/2016  . Premature uterine contractions 07/21/2016  . Indication for care in labor and delivery, antepartum 07/04/2016    Past Surgical History:  Procedure Laterality Date  . LAPAROSCOPIC OOPHERECTOMY Left 2014    Prior to Admission medications   Medication Sig Start Date End Date Taking? Authorizing Provider  doxycycline (VIBRAMYCIN) 100 MG capsule Take 1 capsule (100 mg total) by mouth 2 (two) times daily. 12/20/16   Phineas SemenGoodman, Graydon, MD  ferrous sulfate 325 (65 FE) MG tablet Take 325 mg by mouth daily with breakfast.    [provider]  Prenatal Vit-Fe Fumarate-FA (PRENATAL VITAMIN PO) Take by mouth.    [provider]    Allergies Patient has no known allergies.  Family History  Problem Relation Age of Onset  . Diabetes Maternal Grandmother   . Heart disease Maternal Grandmother     Social History Social History  Substance Use  Topics  . Smoking status: Never Smoker  . Smokeless tobacco: Never Used  . Alcohol use No    Review of Systems  Constitutional: No fever/chills Eyes: No visual changes. No discharge ENT: No upper respiratory complaints. Cardiovascular: no chest pain. Respiratory: no cough. No SOB. Gastrointestinal: Patient has nausea and vomiting.  No diarrhea.  No constipation. Musculoskeletal: Negative for musculoskeletal pain. Skin: Negative for rash, abrasions, lacerations, ecchymosis. Neurological: Negative for headaches, focal weakness or numbness. ____________________________________________   PHYSICAL EXAM:  VITAL SIGNS: ED Triage Vitals [04/07/17 1841]  Enc Vitals Group     BP 111/64     Pulse Rate 95     Resp 12     Temp 99 F (37.2 C)     Temp Source Oral     SpO2 100 %     Weight 127 lb (57.6 kg)     Height 5\' 2"  (1.575 m)     Head Circumference      Peak Flow      Pain Score      Pain Loc      Pain Edu?      Excl. in GC?      Constitutional: Alert and oriented. Well appearing and in no acute distress. Eyes: Conjunctivae are normal. PERRL. EOMI. Head: Atraumatic. ENT:      Ears:       Nose: No congestion/rhinnorhea.      Mouth/Throat: Mucous membranes are moist.  Hematological/Lymphatic/Immunilogical: No cervical lymphadenopathy. Cardiovascular: Normal rate, regular rhythm. Normal S1 and S2.  Good peripheral circulation. Respiratory: Normal respiratory effort without  tachypnea or retractions. Lungs CTAB. Good air entry to the bases with no decreased or absent breath sounds. Gastrointestinal: Bowel sounds 4 quadrants. Soft and nontender to palpation. No guarding or rigidity. No palpable masses. No distention. No CVA tenderness. Musculoskeletal: Full range of motion to all extremities. No gross deformities appreciated. Neurologic:  Normal speech and language. No gross focal neurologic deficits are appreciated.  Skin:  Skin is warm, dry and intact. No rash  noted. Psychiatric: Mood and affect are normal. Speech and behavior are normal. Patient exhibits appropriate insight and judgement.   ____________________________________________   LABS (all labs ordered are listed, but only abnormal results are displayed)  Labs Reviewed  URINALYSIS, COMPLETE (UACMP) WITH MICROSCOPIC - Abnormal; Notable for the following:       Result Value   Color, Urine YELLOW (*)    APPearance CLEAR (*)    Squamous Epithelial / LPF 0-5 (*)    All other components within normal limits  HCG, QUANTITATIVE, PREGNANCY - Abnormal; Notable for the following:    hCG, Beta Chain, Quant, S 185 (*)    All other components within normal limits  POCT PREGNANCY, URINE - Abnormal; Notable for the following:    Preg Test, Ur POSITIVE (*)    All other components within normal limits  POC URINE PREG, ED   ____________________________________________  EKG   ____________________________________________  RADIOLOGY   No results found.  ____________________________________________    PROCEDURES  Procedure(s) performed:    Procedures    Medications - No data to display   ____________________________________________   INITIAL IMPRESSION / ASSESSMENT AND PLAN / ED COURSE  Pertinent labs & imaging results that were available during my care of the patient were reviewed by me and considered in my medical decision making (see chart for details).  Review of the [place] CSRS was performed in accordance of the [facility] prior to dispensing any controlled drugs.     Assessment and plan: Pregnancy: Patient presents to the emergency department with suspicion for pregnancy. POC pregnancy test was positive. Beta hCG 185. Patient was advised to seek care with local OB/GYN. Patient was advised to take prenatal vitamins. Vital signs are reassuring prior to discharge. All patient questions were answered.  ____________________________________________  FINAL CLINICAL IMPRESSION(S)  / ED DIAGNOSES  Final diagnoses:  Less than [redacted] weeks gestation of pregnancy      NEW MEDICATIONS STARTED DURING THIS VISIT:  New Prescriptions   No medications on file        This chart was dictated using voice recognition software/Dragon. Despite best efforts to proofread, errors can occur which can change the meaning. Any change was purely unintentional.    Gasper Lloyd 04/07/17 2044    Phineas Semen, MD 04/07/17 [phone]

## 2017-04-07 NOTE — ED Notes (Signed)
Pt verbalizes understanding of discharge instructions.

## 2017-04-07 NOTE — ED Notes (Signed)
Pt states that she wanted to know how periods work and how she possibly got pregnant.  Pt educated on menstrual cycles and approximate ovulation dates.  Pt requesting education on possible options.  Pt educated on pregnancy options of adoption versus abortion versus keeping child to full term.  Pt also given options on post pregnancy preventative birth control measures.  Pt verbalized understanding.  Pt states that she still needs to discuss pregnancy with father of child.  Pt in NAD and ambulatory to room.

## 2017-04-07 NOTE — ED Notes (Signed)
Pt talking on the phone while RN was giving discharge instructions

## 2017-04-07 NOTE — ED Triage Notes (Signed)
Pt states she was three days late for her cycle this mth and took a preg test today and was positive. Pt wants to confirm that she is pregnant, pt also has a old baby at home and wants to know what her options are about not going through with the pregnancy.

## 2017-04-09 ENCOUNTER — Emergency Department
Admission: EM | Admit: 2017-04-09 | Discharge: 2017-04-09 | Disposition: A | Discharge status: Home / Self Care | Payer: Medicaid Other | Attending: Student in an Organized Health Care Education/Training Program | Admitting: Student in an Organized Health Care Education/Training Program

## 2017-04-09 ENCOUNTER — Encounter: Payer: Self-pay | Admitting: Medical Oncology

## 2017-04-09 ENCOUNTER — Emergency Department: Payer: Medicaid Other

## 2017-04-09 ENCOUNTER — Emergency Department
Admission: EM | Admit: 2017-04-09 | Discharge: 2017-04-09 | Disposition: A | Discharge status: Home / Self Care | Payer: Medicaid Other | Source: Home / Self Care | Attending: Student in an Organized Health Care Education/Training Program | Admitting: Student in an Organized Health Care Education/Training Program

## 2017-04-09 DIAGNOSIS — Z3A00 Weeks of gestation of pregnancy not specified: Secondary | ICD-10-CM | POA: Insufficient documentation

## 2017-04-09 DIAGNOSIS — O3680X0 Pregnancy with inconclusive fetal viability, not applicable or unspecified: Secondary | ICD-10-CM

## 2017-04-09 DIAGNOSIS — R102 Pelvic and perineal pain: Secondary | ICD-10-CM

## 2017-04-09 DIAGNOSIS — B9689 Other specified bacterial agents as the cause of diseases classified elsewhere: Secondary | ICD-10-CM

## 2017-04-09 DIAGNOSIS — R109 Unspecified abdominal pain: Secondary | ICD-10-CM | POA: Diagnosis present

## 2017-04-09 DIAGNOSIS — N760 Acute vaginitis: Secondary | ICD-10-CM | POA: Insufficient documentation

## 2017-04-09 LAB — COMPREHENSIVE METABOLIC PANEL
ALBUMIN: 4.5 g/dL (ref 3.5–5.0)
ALT: 13 U/L — ABNORMAL LOW (ref 14–54)
ANION GAP: 8 (ref 5–15)
AST: 18 U/L (ref 15–41)
Alkaline Phosphatase: 47 U/L (ref 38–126)
BUN: 13 mg/dL (ref 6–20)
CHLORIDE: 101 mmol/L (ref 101–111)
CO2: 26 mmol/L (ref 22–32)
Calcium: 9.9 mg/dL (ref 8.9–10.3)
Creatinine, Ser: 0.49 mg/dL (ref 0.44–1.00)
GFR calc non Af Amer: >60 mL/min (ref >60)
GLUCOSE: 102 mg/dL — AB (ref 65–99)
Potassium: 3.8 mmol/L (ref 3.5–5.1)
SODIUM: 135 mmol/L (ref 135–145)
Total Bilirubin: 0.5 mg/dL (ref 0.3–1.2)
Total Protein: 8.2 g/dL — ABNORMAL HIGH (ref 6.5–8.1)

## 2017-04-09 LAB — URINALYSIS, COMPLETE (UACMP) WITH MICROSCOPIC
BACTERIA UA: NONE SEEN
Bilirubin Urine: NEGATIVE
GLUCOSE, UA: NEGATIVE mg/dL
Hgb urine dipstick: NEGATIVE
Leukocytes, UA: NEGATIVE
Nitrite: NEGATIVE
Protein, ur: NEGATIVE mg/dL
Specific Gravity, Urine: 1.025 (ref 1.005–1.030)
pH: 5.5 (ref 5.0–8.0)

## 2017-04-09 LAB — WET PREP, GENITAL
Sperm: NONE SEEN
Trich, Wet Prep: NONE SEEN
Yeast Wet Prep HPF POC: NONE SEEN

## 2017-04-09 LAB — CHLAMYDIA/NGC RT PCR (ARMC ONLY)
Chlamydia Tr: NOT DETECTED
N gonorrhoeae: NOT DETECTED

## 2017-04-09 LAB — CBC
HCT: 37.2 % (ref 35.0–47.0)
HEMOGLOBIN: 12.8 g/dL (ref 12.0–16.0)
MCH: 30.3 pg (ref 26.0–34.0)
MCHC: 34.5 g/dL (ref 32.0–36.0)
MCV: 87.9 fL (ref 80.0–100.0)
Platelets: 271 10*3/uL (ref 150–440)
RBC: 4.22 MIL/uL (ref 3.80–5.20)
RDW: 13 % (ref 11.5–14.5)
WBC: 7.8 10*3/uL (ref 3.6–11.0)

## 2017-04-09 LAB — LIPASE, BLOOD: LIPASE: 23 U/L (ref 11–51)

## 2017-04-09 LAB — HCG, QUANTITATIVE, PREGNANCY: hCG, Beta Chain, Quant, S: 517 m[IU]/mL — ABNORMAL HIGH (ref <5)

## 2017-04-09 MED ORDER — METRONIDAZOLE 500 MG PO TABS: 500.0000 mg | ORAL_TABLET | Freq: Three times a day (TID) | ORAL | 0 refills | Status: AC

## 2017-04-09 MED ORDER — CALCIUM CARBONATE ANTACID 500 MG PO CHEW
CHEWABLE_TABLET | ORAL | Status: AC
  Filled 2017-04-09: qty 1

## 2017-04-09 MED ORDER — METRONIDAZOLE 500 MG PO TABS
500.0000 mg | ORAL_TABLET | Freq: Once | ORAL | Status: AC
  Administered 2017-04-09: 500 mg via ORAL

## 2017-04-09 MED ORDER — DOXYLAMINE-PYRIDOXINE 10-10 MG PO TBEC: 1.0000 | DELAYED_RELEASE_TABLET | Freq: Two times a day (BID) | ORAL | 0 refills | Status: AC

## 2017-04-09 MED ORDER — METRONIDAZOLE 500 MG PO TABS
ORAL_TABLET | ORAL | Status: AC
  Administered 2017-04-09: 500 mg via ORAL
  Filled 2017-04-09: qty 1

## 2017-04-09 MED ORDER — CALCIUM CARBONATE ANTACID 500 MG PO CHEW
1.0000 | CHEWABLE_TABLET | Freq: Once | ORAL | Status: AC
  Administered 2017-04-09: 200 mg via ORAL

## 2017-04-09 NOTE — ED Provider Notes (Signed)
[facility] Emergency Department Provider Note    First MD Initiated Contact with Patient 04/09/17 1725     (approximate)  I have reviewed the triage vital signs and the nursing notes.   HISTORY  Chief Complaint Abdominal Pain and Flank Pain    HPI Kayla Rodriguez is a 26 y.o. female who is only roughly two weeks pregnant presents with right lower quadrant pain and flank pain associated with vaginal discharge. Patient states that she had similar symptoms when she was last pregnant was diagnosed with an ectopic pregnancy and that is what she is concerned about right now. Denies any dysuria. No urine. No vaginal bleeding. No fevers. No chest pain or shortness of breath. States the pain is stabbing in nature and mild in severity.   Past Medical History:  Diagnosis Date  . Anemia   . Asthma    as needed medications, no recent flare ups  . Ectopic pregnancy 2014  . Iron deficiency    Family History  Problem Relation Age of Onset  . Diabetes Maternal Grandmother   . Heart disease Maternal Grandmother    Past Surgical History:  Procedure Laterality Date  . LAPAROSCOPIC OOPHERECTOMY Left 2014   Patient Active Problem List   Diagnosis Date Noted  . Labor and delivery, indication for care 08/13/2016  . Constipation 08/09/2016  . Uterine contractions during pregnancy 07/23/2016  . Premature uterine contractions 07/21/2016  . Indication for care in labor and delivery, antepartum 07/04/2016      Prior to Admission medications   Medication Sig Start Date End Date Taking? Authorizing Provider  doxycycline (VIBRAMYCIN) 100 MG capsule Take 1 capsule (100 mg total) by mouth 2 (two) times daily. 12/20/16   Phineas Semen, MD  ferrous sulfate 325 (65 FE) MG tablet Take 325 mg by mouth daily with breakfast.    [provider]  Prenatal Vit-Fe Fumarate-FA (PRENATAL VITAMIN PO) Take by mouth.    [provider]    Allergies Patient has no  known allergies.    Social History Social History  Substance Use Topics  . Smoking status: Never Smoker  . Smokeless tobacco: Never Used  . Alcohol use No    Review of Systems Patient denies headaches, rhinorrhea, blurry vision, numbness, shortness of breath, chest pain, edema, cough, abdominal pain, nausea, vomiting, diarrhea, dysuria, fevers, rashes or hallucinations unless otherwise stated above in HPI. ____________________________________________   PHYSICAL EXAM:  VITAL SIGNS: There were no vitals filed for this visit.  Constitutional: Alert and oriented. Well appearing and in no acute distress. Eyes: Conjunctivae are normal.  Head: Atraumatic. Nose: No congestion/rhinnorhea. Mouth/Throat: Mucous membranes are moist.   Neck: No stridor. Painless ROM.  Cardiovascular: Normal rate, regular rhythm. Grossly normal heart sounds.  Good peripheral circulation. Respiratory: Normal respiratory effort.  No retractions. Lungs CTAB. Gastrointestinal: Soft and nontender. No distention. No abdominal bruits. No CVA tenderness. Genitourinary: white discharge but no bleeding or evidence of cervical irritation.  Normal appearing cervical os, no CMT Musculoskeletal: No lower extremity tenderness nor edema.  No joint effusions. Neurologic:  Normal speech and language. No gross focal neurologic deficits are appreciated. No facial droop Skin:  Skin is warm, dry and intact. No rash noted. Psychiatric: Mood and affect are normal. Speech and behavior are normal.  ____________________________________________   LABS (all labs ordered are listed, but only abnormal results are displayed)  Results for orders placed or performed during the hospital encounter of 04/09/17 (from the past 24 hour(s))  Lipase,  blood     Status: None   Collection Time: 04/09/17  5:02 PM  Result Value Ref Range   Lipase 23 11 - 51 U/L  Comprehensive metabolic panel     Status: Abnormal   Collection Time: 04/09/17  5:02  PM  Result Value Ref Range   Sodium 135 135 - 145 mmol/L   Potassium 3.8 3.5 - 5.1 mmol/L   Chloride 101 101 - 111 mmol/L   CO2 26 22 - 32 mmol/L   Glucose, Bld 102 (H) 65 - 99 mg/dL   BUN 13 6 - 20 mg/dL   Creatinine, Ser 4.090.49 0.44 - 1.00 mg/dL   Calcium 9.9 8.9 - 81.110.3 mg/dL   Total Protein 8.2 (H) 6.5 - 8.1 g/dL   Albumin 4.5 3.5 - 5.0 g/dL   AST 18 15 - 41 U/L   ALT 13 (L) 14 - 54 U/L   Alkaline Phosphatase 47 38 - 126 U/L   Total Bilirubin 0.5 0.3 - 1.2 mg/dL   GFR calc non Af Amer >60 >60 mL/min   GFR calc Af Amer >60 >60 mL/min   Anion gap 8 5 - 15  CBC     Status: None   Collection Time: 04/09/17  5:02 PM  Result Value Ref Range   WBC 7.8 3.6 - 11.0 K/uL   RBC 4.22 3.80 - 5.20 MIL/uL   Hemoglobin 12.8 12.0 - 16.0 g/dL   HCT 91.437.2 78.235.0 - 95.647.0 %   MCV 87.9 80.0 - 100.0 fL   MCH 30.3 26.0 - 34.0 pg   MCHC 34.5 32.0 - 36.0 g/dL   RDW 21.313.0 08.611.5 - 57.814.5 %   Platelets 271 150 - 440 K/uL  Urinalysis, Complete w Microscopic     Status: Abnormal   Collection Time: 04/09/17  5:02 PM  Result Value Ref Range   Color, Urine YELLOW YELLOW   APPearance HAZY (A) CLEAR   Specific Gravity, Urine 1.025 1.005 - 1.030   pH 5.5 5.0 - 8.0   Glucose, UA NEGATIVE NEGATIVE mg/dL   Hgb urine dipstick NEGATIVE NEGATIVE   Bilirubin Urine NEGATIVE NEGATIVE   Ketones, ur TRACE (A) NEGATIVE mg/dL   Protein, ur NEGATIVE NEGATIVE mg/dL   Nitrite NEGATIVE NEGATIVE   Leukocytes, UA NEGATIVE NEGATIVE   Squamous Epithelial / LPF 6-30 (A) NONE SEEN   WBC, UA 0-5 0 - 5 WBC/hpf   RBC / HPF 0-5 0 - 5 RBC/hpf   Bacteria, UA NONE SEEN NONE SEEN   Mucous PRESENT    ____________________________________________ ____________________________________________  RADIOLOGY  I personally reviewed all radiographic images ordered to evaluate for the above acute complaints and reviewed radiology reports and findings.  These findings were personally discussed with the patient.  Please see medical record for  radiology report.  ____________________________________________   PROCEDURES  Procedure(s) performed:  Procedures    Critical Care performed: no ____________________________________________   INITIAL IMPRESSION / ASSESSMENT AND PLAN / ED COURSE  Pertinent labs & imaging results that were available during my care of the patient were reviewed by me and considered in my medical decision making (see chart for details).  DDX: ectopic, sti, unlikely pid, vaginosis, stone, pyelo  Merita Nortonshley Lyne is a 26 y.o. who presents to the ED with right flank pain abdominal pain as described above associated with vaginal discharge. Urine shows no evidence of infection and no evidence of blood in the urine making stone less likely. Her abdominal exam is without any peritonitis. The setting of a normal  white count this seems very unlikely to be acute appendicitis. We'll send for culture and evaluate for any evidence of BV or candidiasis.  Clinical Course as of Apr 09 2116  Wed Apr 09, 2017  1918 Beta Sharene Butters is increasing. Awaiting ultrasound  [PR]  2024 Ultrasound shows no evidence of free fluid in the pelvis but is unable to identify intrauterine pregnancy. Repeat abdominal exam is benign. We'll touch base with OB/GYN to arrange close follow-up for repeat hCG testing and close monitoring as she does have a history of ectopic. Patient remains hemodynamically stable.  [PR]    Clinical Course User Index [PR] Willy Eddy, MD   I spoke with Dr. Feliberto Gottron regarding the patient's presentation and he has recommended repeat hCG level on Friday and Sunday with close follow-up in clinic. I placed orders for these repeat labs and discussed signs for which patient should immediately should return to the ER. She denies any worsening of pain. Does appear stable for close follow-up as an outpatient.  ____________________________________________   FINAL CLINICAL IMPRESSION(S) / ED DIAGNOSES  Final  diagnoses:  Bacterial vaginosis  Pregnancy of unknown anatomic location      NEW MEDICATIONS STARTED DURING THIS VISIT:  New Prescriptions   No medications on file     Note:  This document was prepared using Dragon voice recognition software and may include unintentional dictation errors.    Willy Eddy, MD 04/09/17 2119

## 2017-04-09 NOTE — ED Notes (Signed)
Lab notified to add on Hcg level.

## 2017-04-09 NOTE — ED Notes (Signed)
Pelvic cart set up at bedside  

## 2017-04-09 NOTE — ED Notes (Signed)
Patient transported to Ultrasound 

## 2017-04-09 NOTE — Discharge Instructions (Signed)
Return immediately for worsening pain, bleeding, or other concerns.    Return to the medical center on Friday and Sunday for repeat HCG testing.

## 2017-04-09 NOTE — ED Notes (Signed)
Pt reports she has received a text from her mother and she needs to leave to pick her up, pt reports she will return in a little while. Pt in NAD at this time.

## 2017-04-09 NOTE — ED Triage Notes (Signed)
Pt reports that she is 2 weeks preg and for the last 2 days she has been having stabbing flank pain that radiates into lower abdomen with foul smelling vaginal discharge. Pt denies bleeding.

## 2017-04-11 ENCOUNTER — Other Ambulatory Visit
Admission: RE | Admit: 2017-04-11 | Discharge: 2017-04-11 | Disposition: A | Discharge status: Home / Self Care | Payer: Medicaid Other | Source: Ambulatory Visit | Attending: Student in an Organized Health Care Education/Training Program | Admitting: Student in an Organized Health Care Education/Training Program

## 2017-04-11 DIAGNOSIS — Z3200 Encounter for pregnancy test, result unknown: Secondary | ICD-10-CM | POA: Insufficient documentation

## 2017-04-11 LAB — HCG, QUANTITATIVE, PREGNANCY: hCG, Beta Chain, Quant, S: 1113 m[IU]/mL — ABNORMAL HIGH (ref <5)

## 2017-04-13 ENCOUNTER — Other Ambulatory Visit
Admission: RE | Admit: 2017-04-13 | Discharge: 2017-04-13 | Disposition: A | Discharge status: Home / Self Care | Payer: Medicaid Other | Attending: Obstetrics and Gynecology | Admitting: Obstetrics and Gynecology

## 2017-04-13 DIAGNOSIS — Z3200 Encounter for pregnancy test, result unknown: Secondary | ICD-10-CM | POA: Insufficient documentation

## 2017-04-13 LAB — HCG, QUANTITATIVE, PREGNANCY: HCG, BETA CHAIN, QUANT, S: 485 m[IU]/mL — AB (ref <5)

## 2017-04-16 ENCOUNTER — Observation Stay: Payer: Medicaid Other | Admitting: Anesthesiology

## 2017-04-16 ENCOUNTER — Emergency Department: Payer: Medicaid Other

## 2017-04-16 ENCOUNTER — Encounter
Admission: EM | Disposition: A | Discharge status: Home / Self Care | Payer: Self-pay | Source: Home / Self Care | Attending: Emergency Medicine

## 2017-04-16 ENCOUNTER — Observation Stay
Admission: EM | Admit: 2017-04-16 | Discharge: 2017-04-18 | Disposition: A | Discharge status: Home / Self Care | Payer: Medicaid Other | Attending: Obstetrics and Gynecology | Admitting: Obstetrics and Gynecology

## 2017-04-16 ENCOUNTER — Encounter: Payer: Self-pay | Admitting: Medical Oncology

## 2017-04-16 DIAGNOSIS — O009 Unspecified ectopic pregnancy without intrauterine pregnancy: Secondary | ICD-10-CM | POA: Diagnosis present

## 2017-04-16 DIAGNOSIS — O081 Delayed or excessive hemorrhage following ectopic and molar pregnancy: Secondary | ICD-10-CM | POA: Diagnosis not present

## 2017-04-16 DIAGNOSIS — O00102 Left tubal pregnancy without intrauterine pregnancy: Principal | ICD-10-CM | POA: Insufficient documentation

## 2017-04-16 HISTORY — PX: DIAGNOSTIC LAPAROSCOPY WITH REMOVAL OF ECTOPIC PREGNANCY: SHX6449

## 2017-04-16 HISTORY — PX: DILATION AND CURETTAGE OF UTERUS: SHX78

## 2017-04-16 HISTORY — PX: LAPAROSCOPIC UNILATERAL SALPINGECTOMY: SHX5934

## 2017-04-16 LAB — CBC WITH DIFFERENTIAL/PLATELET
Basophils Absolute: 0 10*3/uL (ref 0–0.1)
Basophils Relative: 1 %
EOS PCT: 4 %
Eosinophils Absolute: 0.3 10*3/uL (ref 0–0.7)
HCT: 35.2 % (ref 35.0–47.0)
Hemoglobin: 12.1 g/dL (ref 12.0–16.0)
LYMPHS ABS: 2.1 10*3/uL (ref 1.0–3.6)
LYMPHS PCT: 24 %
MCH: 30.8 pg (ref 26.0–34.0)
MCHC: 34.5 g/dL (ref 32.0–36.0)
MCV: 89.2 fL (ref 80.0–100.0)
MONOS PCT: 7 %
Monocytes Absolute: 0.6 10*3/uL (ref 0.2–0.9)
Neutro Abs: 5.7 10*3/uL (ref 1.4–6.5)
Neutrophils Relative %: 64 %
PLATELETS: 252 10*3/uL (ref 150–440)
RBC: 3.94 MIL/uL (ref 3.80–5.20)
RDW: 13.1 % (ref 11.5–14.5)
WBC: 8.8 10*3/uL (ref 3.6–11.0)

## 2017-04-16 LAB — TYPE AND SCREEN
ABO/RH(D): O POS
Antibody Screen: NEGATIVE

## 2017-04-16 LAB — PROTIME-INR
INR: 0.99
Prothrombin Time: 13.1 seconds (ref 11.4–15.2)

## 2017-04-16 LAB — ABO/RH: ABO/RH(D): O POS

## 2017-04-16 LAB — HCG, QUANTITATIVE, PREGNANCY: hCG, Beta Chain, Quant, S: 462 m[IU]/mL — ABNORMAL HIGH (ref <5)

## 2017-04-16 SURGERY — LAPAROSCOPY, WITH ECTOPIC PREGNANCY SURGICAL TREATMENT
Anesthesia: General | Site: Vagina | Wound class: Clean Contaminated

## 2017-04-16 MED ORDER — MIDAZOLAM HCL 2 MG/2ML IJ SOLN
INTRAMUSCULAR | Status: AC
  Filled 2017-04-16: qty 2

## 2017-04-16 MED ORDER — FENTANYL CITRATE (PF) 250 MCG/5ML IJ SOLN
INTRAMUSCULAR | Status: AC
  Filled 2017-04-16: qty 5

## 2017-04-16 MED ORDER — PROPOFOL 10 MG/ML IV BOLUS
INTRAVENOUS | Status: AC
Start: 2017-04-16 — End: ?
  Filled 2017-04-16: qty 20

## 2017-04-16 MED ORDER — OXYCODONE-ACETAMINOPHEN 5-325 MG PO TABS: 1.0000 | ORAL_TABLET | ORAL | Status: DC | PRN

## 2017-04-16 MED ORDER — ZOLPIDEM TARTRATE 5 MG PO TABS: 5.0000 mg | ORAL_TABLET | Freq: Every evening | ORAL | Status: DC | PRN

## 2017-04-16 MED ORDER — DOXYCYCLINE HYCLATE 100 MG PO TABS
100.0000 mg | ORAL_TABLET | Freq: Once | ORAL | Status: AC
  Administered 2017-04-16: 100 mg via ORAL
  Filled 2017-04-16: qty 1

## 2017-04-16 MED ORDER — ALUM & MAG HYDROXIDE-SIMETH 200-200-20 MG/5ML PO SUSP: 30.0000 mL | ORAL | Status: DC | PRN

## 2017-04-16 MED ORDER — BISACODYL 5 MG PO TBEC: 5.0000 mg | DELAYED_RELEASE_TABLET | Freq: Every day | ORAL | Status: DC | PRN

## 2017-04-16 MED ORDER — LIDOCAINE HCL (PF) 2 % IJ SOLN
INTRAMUSCULAR | Status: AC
  Filled 2017-04-16: qty 2

## 2017-04-16 MED ORDER — HYDROMORPHONE HCL 1 MG/ML IJ SOLN
0.2000 mg | INTRAMUSCULAR | Status: DC | PRN
  Administered 2017-04-17: 0.6 mg via INTRAVENOUS
  Filled 2017-04-16: qty 1

## 2017-04-16 MED ORDER — MAGNESIUM CITRATE PO SOLN
1.0000 | Freq: Once | ORAL | Status: DC | PRN
  Filled 2017-04-16: qty 296

## 2017-04-16 MED ORDER — SILVER NITRATE-POT NITRATE 75-25 % EX MISC
CUTANEOUS | Status: AC
  Filled 2017-04-16: qty 4

## 2017-04-16 MED ORDER — FENTANYL CITRATE (PF) 100 MCG/2ML IJ SOLN
50.0000 ug | Freq: Once | INTRAMUSCULAR | Status: AC
  Administered 2017-04-16: 50 ug via INTRAVENOUS
  Filled 2017-04-16: qty 2

## 2017-04-16 MED ORDER — METHYLENE BLUE 0.5 % INJ SOLN
INTRAVENOUS | Status: AC
  Filled 2017-04-16: qty 10

## 2017-04-16 MED ORDER — ONDANSETRON HCL 4 MG PO TABS: 4.0000 mg | ORAL_TABLET | Freq: Four times a day (QID) | ORAL | Status: DC | PRN

## 2017-04-16 MED ORDER — FENTANYL CITRATE (PF) 100 MCG/2ML IJ SOLN
INTRAMUSCULAR | Status: AC
  Administered 2017-04-16: 50 ug via INTRAVENOUS
  Filled 2017-04-16: qty 2

## 2017-04-16 MED ORDER — MIDAZOLAM HCL 2 MG/2ML IJ SOLN
INTRAMUSCULAR | Status: DC | PRN
  Administered 2017-04-16: 2 mg via INTRAVENOUS

## 2017-04-16 MED ORDER — LACTATED RINGERS IV SOLN
INTRAVENOUS | Status: DC
  Administered 2017-04-16 – 2017-04-17 (×2): via INTRAVENOUS

## 2017-04-16 MED ORDER — FENTANYL CITRATE (PF) 100 MCG/2ML IJ SOLN
50.0000 ug | Freq: Once | INTRAMUSCULAR | Status: AC
  Administered 2017-04-16: 50 ug via INTRAVENOUS

## 2017-04-16 MED ORDER — BUPIVACAINE HCL (PF) 0.5 % IJ SOLN
INTRAMUSCULAR | Status: AC
Start: 2017-04-16 — End: ?
  Filled 2017-04-16: qty 30

## 2017-04-16 MED ORDER — ONDANSETRON HCL 4 MG/2ML IJ SOLN
4.0000 mg | Freq: Four times a day (QID) | INTRAMUSCULAR | Status: DC | PRN
  Administered 2017-04-17: 4 mg via INTRAVENOUS

## 2017-04-16 MED ORDER — ROCURONIUM BROMIDE 50 MG/5ML IV SOLN
INTRAVENOUS | Status: AC
  Filled 2017-04-16: qty 1

## 2017-04-16 MED ORDER — DOCUSATE SODIUM 100 MG PO CAPS: 100.0000 mg | ORAL_CAPSULE | Freq: Two times a day (BID) | ORAL | Status: DC

## 2017-04-16 MED ORDER — IBUPROFEN 600 MG PO TABS: 600.0000 mg | ORAL_TABLET | Freq: Four times a day (QID) | ORAL | Status: DC | PRN

## 2017-04-16 MED ORDER — SUCCINYLCHOLINE CHLORIDE 20 MG/ML IJ SOLN
INTRAMUSCULAR | Status: AC
  Filled 2017-04-16: qty 1

## 2017-04-16 MED ORDER — MAGNESIUM HYDROXIDE 400 MG/5ML PO SUSP: 30.0000 mL | Freq: Every day | ORAL | Status: DC | PRN

## 2017-04-16 SURGICAL SUPPLY — 41 items
BAG URO DRAIN 2000ML W/SPOUT (MISCELLANEOUS) ×4 IMPLANT
BLADE SURG SZ11 CARB STEEL (BLADE) ×4 IMPLANT
CATH FOLEY 2WAY  5CC 16FR (CATHETERS) ×2
CATH ROBINSON RED A/P 16FR (CATHETERS) ×4 IMPLANT
CATH URTH 16FR FL 2W BLN LF (CATHETERS) ×2 IMPLANT
CHLORAPREP W/TINT 26ML (MISCELLANEOUS) ×4 IMPLANT
CLOSURE WOUND 1/4X4 (GAUZE/BANDAGES/DRESSINGS) ×1
DERMABOND ADVANCED (GAUZE/BANDAGES/DRESSINGS) ×2
DERMABOND ADVANCED .7 DNX12 (GAUZE/BANDAGES/DRESSINGS) ×2 IMPLANT
DRSG TEGADERM 2-3/8X2-3/4 SM (GAUZE/BANDAGES/DRESSINGS) ×12 IMPLANT
ENDOPOUCH RETRIEVER 10 (MISCELLANEOUS) ×4 IMPLANT
GAUZE SPONGE NON-WVN 2X2 STRL (MISCELLANEOUS) ×4 IMPLANT
GLOVE BIO SURGEON STRL SZ7 (GLOVE) ×8 IMPLANT
GLOVE INDICATOR 7.5 STRL GRN (GLOVE) ×4 IMPLANT
GOWN STRL REUS W/ TWL LRG LVL3 (GOWN DISPOSABLE) ×4 IMPLANT
GOWN STRL REUS W/TWL LRG LVL3 (GOWN DISPOSABLE) ×4
IRRIGATION STRYKERFLOW (MISCELLANEOUS) ×2 IMPLANT
IRRIGATOR STRYKERFLOW (MISCELLANEOUS) ×4
IV NS 1000ML (IV SOLUTION) ×2
IV NS 1000ML BAXH (IV SOLUTION) ×2 IMPLANT
KIT RM TURNOVER CYSTO AR (KITS) ×4 IMPLANT
LABEL OR SOLS (LABEL) ×4 IMPLANT
LIGASURE LAP MARYLAND 5MM 37CM (ELECTROSURGICAL) ×4 IMPLANT
NS IRRIG 500ML POUR BTL (IV SOLUTION) ×4 IMPLANT
PACK DNC HYST (MISCELLANEOUS) ×4 IMPLANT
PACK GYN LAPAROSCOPIC (MISCELLANEOUS) ×4 IMPLANT
PAD OB MATERNITY 4.3X12.25 (PERSONAL CARE ITEMS) ×4 IMPLANT
PAD PREP 24X41 OB/GYN DISP (PERSONAL CARE ITEMS) ×4 IMPLANT
SCISSORS METZENBAUM CVD 33 (INSTRUMENTS) ×4 IMPLANT
SLEEVE ENDOPATH XCEL 5M (ENDOMECHANICALS) ×4 IMPLANT
SPONGE VERSALON 2X2 STRL (MISCELLANEOUS) ×4
STRIP CLOSURE SKIN 1/4X4 (GAUZE/BANDAGES/DRESSINGS) ×3 IMPLANT
SUT MNCRL AB 4-0 PS2 18 (SUTURE) ×4 IMPLANT
SUT VIC AB 2-0 UR6 27 (SUTURE) ×4 IMPLANT
SUT VIC AB 4-0 SH 27 (SUTURE) ×2
SUT VIC AB 4-0 SH 27XANBCTRL (SUTURE) ×2 IMPLANT
SWABSTK COMLB BENZOIN TINCTURE (MISCELLANEOUS) ×4 IMPLANT
TROCAR ENDO BLADELESS 11MM (ENDOMECHANICALS) ×4 IMPLANT
TROCAR XCEL NON-BLD 5MMX100MML (ENDOMECHANICALS) ×4 IMPLANT
TROCAR XCEL UNIV SLVE 11M 100M (ENDOMECHANICALS) ×4 IMPLANT
TUBING INSUFFLATOR HI FLOW (MISCELLANEOUS) ×4 IMPLANT

## 2017-04-16 NOTE — Consult Note (Signed)
Consult History and Physical   SERVICE: Gynecology   Patient Name: Kayla Rodriguez Patient MRN:   [id number]  CC: Pelvic pain, vaginal bleeding, suspected ectopic  HPI: Kayla Rodriguez is a 26 y.o. [phone] with a hx of prior ectopic who presents with vaginal bleeding, pelvic pain, and a mass on left ovary that appears to be an ectopic.   She has hx of beta dropping this week. - 7d agp 517, 5 d ago 1113, 3 d ago 485, today 462. Yesterday in the [facility] labs her quant was 422.  She is very concerned about another Ruptured ectopic pregnancy, as the last one necessitated emergency surgery with salpingectomy, and she internalized that she could have died.  Review of Systems: positives in bold GEN:   fevers, chills, weight changes, appetite changes, fatigue, night sweats HEENT:  HA, vision changes, hearing loss, congestion, rhinorrhea, sinus pressure, dysphagia CV:   CP, palpitations PULM:  SOB, cough GI:  abd pain, N/V/D/C,  GU:  dysuria, urgency, frequency, vaginal bleeding, pelvic pain MSK:  arthralgias, myalgias, back pain, swelling SKIN:  rashes, color changes, pallor NEURO:  numbness, weakness, tingling, seizures, dizziness, tremors PSYCH:  depression, anxiety, behavioral problems, confusion  HEME/LYMPH:  easy bruising or bleeding ENDO:  heat/cold intolerance  Past Obstetrical History: OB History    Gravida Para Term Preterm AB Living   4 1 1   1 1    SAB TAB Ectopic Multiple Live Births   1       1      Past Gynecologic History: Patient's last menstrual period was 03/04/2017 (exact date).   Past Medical History: Past Medical History:  Diagnosis Date  . Anemia   . Asthma    as needed medications, no recent flare ups  . Ectopic pregnancy 2014  . Iron deficiency     Past Surgical History:   Past Surgical History:  Procedure Laterality Date  . LAPAROSCOPIC OOPHERECTOMY Left 2014    Family History:  family history includes Diabetes in her maternal grandmother; Heart  disease in her maternal grandmother.  Social History:  Social History   Social History  . Marital status: Single    Spouse name: N/A  . Number of children: N/A  . Years of education: N/A   Occupational History  . stay home    Social History Main Topics  . Smoking status: Never Smoker  . Smokeless tobacco: Never Used  . Alcohol use No  . Drug use: No  . Sexual activity: Yes    Birth control/ protection: Implant   Other Topics Concern  . Not on file   Social History Narrative  . No narrative on file    Home Medications:  Medications reconciled in EPIC  No current facility-administered medications on file prior to encounter.    Current Outpatient Prescriptions on File Prior to Encounter  Medication Sig Dispense Refill  . doxycycline (VIBRAMYCIN) 100 MG capsule Take 1 capsule (100 mg total) by mouth 2 (two) times daily. 28 capsule 8  . Doxylamine-Pyridoxine 10-10 MG TBEC Take 1 tablet by mouth 2 (two) times daily. 20 tablet 0  . ferrous sulfate 325 (65 FE) MG tablet Take 325 mg by mouth daily with breakfast.    . metroNIDAZOLE (FLAGYL) 500 MG tablet Take 1 tablet (500 mg total) by mouth 3 (three) times daily. 21 tablet 0  . Prenatal Vit-Fe Fumarate-FA (PRENATAL VITAMIN PO) Take by mouth.      Allergies:  No Known Allergies  Physical Exam:  Temp:  [  98.6 F (37 C)] 98.6 F (37 C) (06/27 1327) Pulse Rate:  [80] 80 (06/27 1327) Resp:  [18] 18 (06/27 1327) BP: (115)/(48) 115/48 (06/27 1327) SpO2:  [100 %] 100 % (06/27 1327) Weight:  [57.6 kg (127 lb)] 57.6 kg (127 lb) (06/27 1327)   General Appearance:  Well developed, well nourished, no acute distress, alert and oriented x3 HEENT:  Normocephalic atraumatic, extraocular movements intact, moist mucous membranes Cardiovascular:  Normal S1/S2, regular rate and rhythm, no murmurs Pulmonary:  clear to auscultation, no wheezes, rales or rhonchi, symmetric air entry, good air exchange Abdomen:  Bowel sounds present, soft,  mildly tender throughout, no peritoneal signs, nondistended, no abnormal masses, no epigastric pain Extremities:  Full range of motion, no pedal edema, 2+ distal pulses, no tenderness Skin:  normal coloration and turgor, no rashes, no suspicious skin lesions noted  Neurologic:  Cranial nerves 2-12 grossly intact, normal muscle tone, strength 5/5 all four extremities Psychiatric:  Normal mood and affect, appropriate, no AH/VH Pelvic:  NEFG, no vulvar masses or lesions, normal vaginal mucosa, +vagina bleeding   Labs/Studies:   CBC and Coags:  Lab Results  Component Value Date   WBC 8.8 04/16/2017   NEUTOPHILPCT 64 04/16/2017   EOSPCT 4 04/16/2017   BASOPCT 1 04/16/2017   LYMPHOPCT 24 04/16/2017   HGB 12.1 04/16/2017   HCT 35.2 04/16/2017   MCV 89.2 04/16/2017   PLT 252 04/16/2017   INR 0.99 04/16/2017   CMP:  Lab Results  Component Value Date   NA 135 04/09/2017   K 3.8 04/09/2017   CL 101 04/09/2017   CO2 26 04/09/2017   BUN 13 04/09/2017   CREATININE 0.49 04/09/2017   CREATININE 0.45 12/20/2016   CREATININE 0.48 08/03/2016   PROT 8.2 (H) 04/09/2017   BILITOT 0.5 04/09/2017   ALT 13 (L) 04/09/2017   AST 18 04/09/2017   ALKPHOS 47 04/09/2017   Other Imaging: US Ob Comp < 14 Wks  Addendum Date: 04/16/2017   ADDENDUM REPORT: 04/16/2017 16:11 ADDENDUM: Study discussed by telephone with Dr. Lamont Snowball, Lloyd Huger on 04/16/2017 at 1602 hours. Electronically Signed   By: Odessa Fleming M.D.   On: 04/16/2017 16:11   Result Date: 04/16/2017 CLINICAL DATA:  26 year old female with vaginal bleeding and decreasing quantitative beta HCG over the past 5 days (1,113 down to 462 today). Estimated gestational age by LMP 6 weeks 1 day. EXAM: OBSTETRIC <14 WK [place] AND TRANSVAGINAL OB US TECHNIQUE: Both transabdominal and transvaginal ultrasound examinations were performed for complete evaluation of the gestation as well as the maternal uterus, adnexal regions, and pelvic cul-de-sac. Transvaginal technique  was performed to assess early pregnancy. COMPARISON:  04/09/2017. FINDINGS: Intrauterine gestational sac: None Yolk sac:  None Embryo:  None Cardiac Activity: Not applicable Maternal uterus/adnexae: The endometrium is thickened up to 13 mm but otherwise appears bland. There is only trace simple appearing free fluid in the cul-de-sac (image 71). The right ovary appears normal measuring 2.6 x 83.0 x 1.7 cm. Multiple small follicles. The left ovary is more indistinct, with an adjacent rounded and complex echogenic area measuring 1.9 cm as seen on images 121 and 122. There is surrounding hypervascularity (Image 122). See also image 146. IMPRESSION: 1. No IUP, and a 2 cm complex area adjacent to the left ovary with hypervascularity is highly suspicious for left adnexal ectopic pregnancy. 2. Trace simple appearing pelvic free fluid. 3. Normal right ovary. Electronically Signed: By: Odessa Fleming M.D. On: 04/16/2017 15:59   US  Ob Comp Less 14 Wks  Result Date: 04/09/2017 CLINICAL DATA:  RIGHT pelvic pain for 2 days, history of ectopic pregnancy and prior LEFT salpingectomy, quantitative beta HCG = 517 (2 days ago = 185) EXAM: OBSTETRIC <14 WK [place] AND TRANSVAGINAL OB US TECHNIQUE: Both transabdominal and transvaginal ultrasound examinations were performed for complete evaluation of the gestation as well as the maternal uterus, adnexal regions, and pelvic cul-de-sac. Transvaginal technique was performed to assess early pregnancy. COMPARISON:  Pelvic ultrasound 12/20/2016 FINDINGS: Intrauterine gestational sac: Not identified Yolk sac:  N/A Embryo:  N/A Cardiac Activity: N/A Heart Rate: N/A  bpm Subchorionic hemorrhage:  N/A Maternal uterus/adnexae: Uterus demonstrates a thickened endometrial complex without focal mass or fluid collection. RIGHT ovary normal size and morphology, 2.9 x 1.7 x 2.2 cm. LEFT ovary normal size and morphology 3.0 x 3.0 x 1.6 cm. Blood flow present within both ovaries on color Doppler imaging. No  adnexal masses or free pelvic fluid. IMPRESSION: No intrauterine gestation identified. Findings are compatible with pregnancy of unknown location. Differential diagnosis includes early intrauterine pregnancy too early to visualize, spontaneous abortion, and ectopic pregnancy. Serial quantitative beta HCG and or followup ultrasound recommended to definitively exclude ectopic pregnancy. Electronically Signed   By: Ulyses Southward M.D.   On: 04/09/2017 19:45   US Ob Transvaginal  Addendum Date: 04/16/2017   ADDENDUM REPORT: 04/16/2017 16:11 ADDENDUM: Study discussed by telephone with Dr. Lamont Snowball, NEIL on 04/16/2017 at 1602 hours. Electronically Signed   By: Odessa Fleming M.D.   On: 04/16/2017 16:11   Result Date: 04/16/2017 CLINICAL DATA:  26 year old female with vaginal bleeding and decreasing quantitative beta HCG over the past 5 days (1,113 down to 462 today). Estimated gestational age by LMP 6 weeks 1 day. EXAM: OBSTETRIC <14 WK [place] AND TRANSVAGINAL OB US TECHNIQUE: Both transabdominal and transvaginal ultrasound examinations were performed for complete evaluation of the gestation as well as the maternal uterus, adnexal regions, and pelvic cul-de-sac. Transvaginal technique was performed to assess early pregnancy. COMPARISON:  04/09/2017. FINDINGS: Intrauterine gestational sac: None Yolk sac:  None Embryo:  None Cardiac Activity: Not applicable Maternal uterus/adnexae: The endometrium is thickened up to 13 mm but otherwise appears bland. There is only trace simple appearing free fluid in the cul-de-sac (image 71). The right ovary appears normal measuring 2.6 x 83.0 x 1.7 cm. Multiple small follicles. The left ovary is more indistinct, with an adjacent rounded and complex echogenic area measuring 1.9 cm as seen on images 121 and 122. There is surrounding hypervascularity (Image 122). See also image 146. IMPRESSION: 1. No IUP, and a 2 cm complex area adjacent to the left ovary with hypervascularity is highly suspicious  for left adnexal ectopic pregnancy. 2. Trace simple appearing pelvic free fluid. 3. Normal right ovary. Electronically Signed: By: Odessa Fleming M.D. On: 04/16/2017 15:59   US Ob Transvaginal  Result Date: 04/09/2017 CLINICAL DATA:  RIGHT pelvic pain for 2 days, history of ectopic pregnancy and prior LEFT salpingectomy, quantitative beta HCG = 517 (2 days ago = 185) EXAM: OBSTETRIC <14 WK [place] AND TRANSVAGINAL OB US TECHNIQUE: Both transabdominal and transvaginal ultrasound examinations were performed for complete evaluation of the gestation as well as the maternal uterus, adnexal regions, and pelvic cul-de-sac. Transvaginal technique was performed to assess early pregnancy. COMPARISON:  Pelvic ultrasound 12/20/2016 FINDINGS: Intrauterine gestational sac: Not identified Yolk sac:  N/A Embryo:  N/A Cardiac Activity: N/A Heart Rate: N/A  bpm Subchorionic hemorrhage:  N/A Maternal uterus/adnexae: Uterus demonstrates a  thickened endometrial complex without focal mass or fluid collection. RIGHT ovary normal size and morphology, 2.9 x 1.7 x 2.2 cm. LEFT ovary normal size and morphology 3.0 x 3.0 x 1.6 cm. Blood flow present within both ovaries on color Doppler imaging. No adnexal masses or free pelvic fluid. IMPRESSION: No intrauterine gestation identified. Findings are compatible with pregnancy of unknown location. Differential diagnosis includes early intrauterine pregnancy too early to visualize, spontaneous abortion, and ectopic pregnancy. Serial quantitative beta HCG and or followup ultrasound recommended to definitively exclude ectopic pregnancy. Electronically Signed   By: Ulyses SouthwardMark  Boles M.D.   On: 04/09/2017 19:45     Assessment / Plan:   Kayla Rodriguez is a 26 y.o. [id number] who presents with ectopic pregnancy.  1.  On exam, she had stable vital signs, and and abdominal pain. Patient was counseled regarding options, including expectant management, methotrexate, surgery. Because she is hemodynamically stable and  without a ruptured ectopic, as well as her beta hCG dropping, expectant management is a reasonable plan. The patient is very leery of this idea because of her prior history of ruptured ectopic.   Furthermore, she is very interested in sterilization. I have let her know that this surgery would be to remove an ectopic and stabilize her, and would not be intended for sterilization, but that if she has lost both of her tubes, she will indeed be unable to get pregnant without IVF in the future. This idea is very appealing to her, and she requests it emphatically. She does also state that last year with her pregnancy she desired sterilization, and she feels that she was obstructed in getting it.   Risks of surgery including bleeding which may require transfusion or reoperation, infection, injury to bowel or other surrounding organs, need for additional procedures including (laparoscopy or) laparotomy were explained to patient and written informed consent was obtained.  Patient has been NPO since today and she will remain NPO for procedure. Anesthesia and OR aware.  Preoperative prophylactic antibiotics and SCDs ordered on call to the OR.  To OR when ready.       Thank you for the opportunity to be involved with this pt's care.

## 2017-04-16 NOTE — ED Triage Notes (Signed)
Pt here via [facility] with c/o lower abdominal pain and vaginal bleeding. Pt is approximately [redacted] weeks pregnant, HCG levels have been fluctuating.

## 2017-04-16 NOTE — ED Provider Notes (Signed)
[facility] Emergency Department Provider Note  ____________________________________________   First MD Initiated Contact with Patient 04/16/17 1543     (approximate)  I have reviewed the triage vital signs and the nursing notes.   HISTORY  Chief Complaint Vaginal Bleeding   HPI Kayla Rodriguez is a 26 y.o. female who comes to the emergency department with worsening vaginal bleeding and left lower quadrant pain for the past 24 hours. She is G4 P2 roughly [redacted] weeks pregnant with a possible ectopic pregnancy. She has 1 previous ectopic pregnancy in the past and is status post right salpingectomy in 2015. She has been followed by Dr. Verna CzechShermerhorn for the past week or so receiving serial beta hCGs and pelvic ultrasounds. She was most recently seen yesterday and had a beta hCG of422. She was scheduled for follow-up tomorrow and if her hCG plateaued or rose she was going to be treated as an ectopic pregnancy. She presented to the emergency department today because at noon today she had a sudden rush of increasing bright blood from her vagina and worsening pain.   Past Medical History:  Diagnosis Date  . Anemia   . Asthma    as needed medications, no recent flare ups  . Ectopic pregnancy 2014  . Iron deficiency     Patient Active Problem List   Diagnosis Date Noted  . Labor and delivery, indication for care 08/13/2016  . Constipation 08/09/2016  . Uterine contractions during pregnancy 07/23/2016  . Premature uterine contractions 07/21/2016  . Indication for care in labor and delivery, antepartum 07/04/2016    Past Surgical History:  Procedure Laterality Date  . LAPAROSCOPIC OOPHERECTOMY Left 2014    Prior to Admission medications   Medication Sig Start Date End Date Taking? Authorizing Provider  doxycycline (VIBRAMYCIN) 100 MG capsule Take 1 capsule (100 mg total) by mouth 2 (two) times daily. Patient not taking: Reported on 04/16/2017 12/20/16   Phineas SemenGoodman,  Graydon, MD  Doxylamine-Pyridoxine 10-10 MG TBEC Take 1 tablet by mouth 2 (two) times daily. Patient not taking: Reported on 04/16/2017 04/09/17   Willy Eddyobinson, Patrick, MD  metroNIDAZOLE (FLAGYL) 500 MG tablet Take 1 tablet (500 mg total) by mouth 3 (three) times daily. Patient not taking: Reported on 04/16/2017 04/09/17 04/16/17  Willy Eddyobinson, Patrick, MD    Allergies Patient has no known allergies.  Family History  Problem Relation Age of Onset  . Diabetes Maternal Grandmother   . Heart disease Maternal Grandmother     Social History Social History  Substance Use Topics  . Smoking status: Never Smoker  . Smokeless tobacco: Never Used  . Alcohol use No    Review of Systems Constitutional: No fever/chills Eyes: No visual changes. ENT: No sore throat. Cardiovascular: Denies chest pain. Respiratory: Denies shortness of breath. Gastrointestinal: Positive abdominal pain.  Positive nausea, no vomiting.  No diarrhea.  No constipation. Genitourinary: Negative for dysuria. Musculoskeletal: Negative for back pain. Skin: Negative for rash. Neurological: Negative for headaches, focal weakness or numbness.   ____________________________________________   PHYSICAL EXAM:  VITAL SIGNS: ED Triage Vitals  Enc Vitals Group     BP 04/16/17 1327 (!) 115/48     Pulse Rate 04/16/17 1327 80     Resp 04/16/17 1327 18     Temp 04/16/17 1327 98.6 F (37 C)     Temp Source 04/16/17 1327 Oral     SpO2 04/16/17 1327 100 %     Weight 04/16/17 1327 127 lb (57.6 kg)  Height 04/16/17 1327 5\' 2"  (1.575 m)     Head Circumference --      Peak Flow --      Pain Score 04/16/17 1326 7     Pain Loc --      Pain Edu? --      Excl. in GC? --     Constitutional: Alert and oriented 4 tech stating laughing very well-appearing Eyes: PERRL EOMI. Head: Atraumatic. Nose: No congestion/rhinnorhea. Mouth/Throat: No trismus Neck: No stridor.   Cardiovascular: Normal rate, regular rhythm. Grossly normal heart  sounds.  Good peripheral circulation. Respiratory: Normal respiratory effort.  No retractions. Lungs CTAB and moving good air Gastrointestinal: Soft nondistended quite tender in left lower quadrant with rebound but no guarding  Musculoskeletal: No lower extremity edema   Neurologic:  Normal speech and language. No gross focal neurologic deficits are appreciated. Skin:  Skin is warm, dry and intact. No rash noted. Psychiatric: Mood and affect are normal. Speech and behavior are normal.    ____________________________________________   DIFFERENTIAL includes but not limited to  Ectopic pregnancy, ruptured ectopic pregnancy, miscarriage ____________________________________________   LABS (all labs ordered are listed, but only abnormal results are displayed)  Labs Reviewed  HCG, QUANTITATIVE, PREGNANCY - Abnormal; Notable for the following:       Result Value   hCG, Beta Chain, Quant, S 462 (*)    All other components within normal limits  CBC WITH DIFFERENTIAL/PLATELET  PROTIME-INR  COMPREHENSIVE METABOLIC PANEL  ABO/RH  TYPE AND SCREEN    Rising hCG is concerning for ectopic pregnancy __________________________________________  EKG   ____________________________________________  RADIOLOGY  Pelvic ultrasound shows no intrauterine pregnancy and mass in the left adnexa concerning for ectopic pregnancy ____________________________________________   PROCEDURES  Procedure(s) performed: no  Procedures  Critical Care performed: yes  CRITICAL CARE Performed by: Merrily Brittle   Total critical care time: 35 minutes  Critical care time was exclusive of separately billable procedures and treating other patients.  Critical care was necessary to treat or prevent imminent or life-threatening deterioration.  Critical care was time spent personally by me on the following activities: development of treatment plan with patient and/or surrogate as well as nursing, discussions  with consultants, evaluation of patient's response to treatment, examination of patient, obtaining history from patient or surrogate, ordering and performing treatments and interventions, ordering and review of laboratory studies, ordering and review of radiographic studies, pulse oximetry and re-evaluation of patient's condition.   Observation: no ____________________________________________   INITIAL IMPRESSION / ASSESSMENT AND PLAN / ED COURSE  Pertinent labs & imaging results that were available during my care of the patient were reviewed by me and considered in my medical decision making (see chart for details).  The patient arrives hemodynamically stable and well appearing although with significantly tender abdomen. Her hCG today is up from yesterday which based on Dr. Francesca Oman last note is consistent with ectopic pregnancy.  Line, labs, type and screen, and I'll touch base with Dr. Bary Leriche.     ----------------------------------------- 4:04 PM on 04/16/2017 -----------------------------------------  I received a phone call from Dr. Margo Aye of radiology the patient's ultrasound is concerning for ectopic pregnancy. It is not ruptured. I have a call out to [facility] gynecology now. ____________________________________________  Dr. Dalbert Garnet came to bedside and evaluated the patient. She offered her methotrexate versus surgical correction versus watchful waiting and the patient opted for surgery. She'll be taken to the operating room tonight.  FINAL CLINICAL IMPRESSION(S) / ED DIAGNOSES  Final diagnoses:  Ectopic pregnancy without intrauterine pregnancy, unspecified location      NEW MEDICATIONS STARTED DURING THIS VISIT:  New Prescriptions   No medications on file     Note:  This document was prepared using Dragon voice recognition software and may include unintentional dictation errors.     Merrily Brittle, MD 04/16/17 1958

## 2017-04-16 NOTE — ED Triage Notes (Signed)
Pt reports she has been having fluctuating HCG levels with multiple ultrasounds that possibly indicated ectopic pregnancy. Pt states that she is approx 6 weeks preg and for the past week she has been having increasing vaginal bleeding with left lower abd pain.

## 2017-04-16 NOTE — Anesthesia Preprocedure Evaluation (Addendum)
Anesthesia Evaluation  Patient identified by MRN, date of birth, ID band Patient awake    Reviewed: Allergy & Precautions, NPO status , Patient's Chart, lab work & pertinent test results, reviewed documented beta blocker date and time   Airway Mallampati: II  TM Distance: >3 FB     Dental  (+) Chipped   Pulmonary asthma ,           Cardiovascular      Neuro/Psych    GI/Hepatic   Endo/Other    Renal/GU      Musculoskeletal   Abdominal   Peds  Hematology  (+) anemia ,   Anesthesia Other Findings   Reproductive/Obstetrics                             Anesthesia Physical Anesthesia Plan  ASA: II  Anesthesia Plan: General   Post-op Pain Management:    Induction: Intravenous and Rapid sequence  PONV Risk Score and Plan:   Airway Management Planned: Oral ETT  Additional Equipment:   Intra-op Plan:   Post-operative Plan:   Informed Consent: I have reviewed the patients History and Physical, chart, labs and discussed the procedure including the risks, benefits and alternatives for the proposed anesthesia with the patient or authorized representative who has indicated his/her understanding and acceptance.     Plan Discussed with: CRNA  Anesthesia Plan Comments:         Anesthesia Quick Evaluation

## 2017-04-16 NOTE — ED Notes (Signed)
OB at bedside

## 2017-04-16 NOTE — ED Notes (Signed)
Doxycycline given to transport in order to be given to OR RN.  RN verified that she will give medication in holding area per MD orders.

## 2017-04-17 ENCOUNTER — Encounter: Payer: Self-pay | Admitting: Obstetrics and Gynecology

## 2017-04-17 LAB — CBC
HCT: 35 % (ref 35.0–47.0)
Hemoglobin: 11.8 g/dL — ABNORMAL LOW (ref 12.0–16.0)
MCH: 30.2 pg (ref 26.0–34.0)
MCHC: 33.9 g/dL (ref 32.0–36.0)
MCV: 89.3 fL (ref 80.0–100.0)
PLATELETS: 242 10*3/uL (ref 150–440)
RBC: 3.92 MIL/uL (ref 3.80–5.20)
RDW: 12.9 % (ref 11.5–14.5)
WBC: 8 10*3/uL (ref 3.6–11.0)

## 2017-04-17 LAB — BASIC METABOLIC PANEL
Anion gap: 3 — ABNORMAL LOW (ref 5–15)
BUN: 8 mg/dL (ref 6–20)
CALCIUM: 8.8 mg/dL — AB (ref 8.9–10.3)
CO2: 27 mmol/L (ref 22–32)
CREATININE: 0.6 mg/dL (ref 0.44–1.00)
Chloride: 105 mmol/L (ref 101–111)
GFR calc Af Amer: >60 mL/min (ref >60)
GLUCOSE: 125 mg/dL — AB (ref 65–99)
POTASSIUM: 3.7 mmol/L (ref 3.5–5.1)
SODIUM: 135 mmol/L (ref 135–145)

## 2017-04-17 MED ORDER — SIMETHICONE 80 MG PO CHEW
80.0000 mg | CHEWABLE_TABLET | Freq: Four times a day (QID) | ORAL | Status: DC | PRN
  Administered 2017-04-17: 80 mg via ORAL
  Filled 2017-04-17: qty 1

## 2017-04-17 MED ORDER — FENTANYL CITRATE (PF) 100 MCG/2ML IJ SOLN: 25.0000 ug | INTRAMUSCULAR | Status: DC | PRN

## 2017-04-17 MED ORDER — DOCUSATE SODIUM 100 MG PO CAPS
100.0000 mg | ORAL_CAPSULE | Freq: Two times a day (BID) | ORAL | Status: DC
  Administered 2017-04-17 – 2017-04-18 (×2): 100 mg via ORAL
  Filled 2017-04-17 (×2): qty 1

## 2017-04-17 MED ORDER — CELECOXIB 200 MG PO CAPS
200.0000 mg | ORAL_CAPSULE | Freq: Two times a day (BID) | ORAL | Status: DC
  Administered 2017-04-17 (×2): 200 mg via ORAL
  Filled 2017-04-17 (×2): qty 1

## 2017-04-17 MED ORDER — PROPOFOL 10 MG/ML IV BOLUS
INTRAVENOUS | Status: DC | PRN
  Administered 2017-04-16: 100 mg via INTRAVENOUS

## 2017-04-17 MED ORDER — OXYCODONE HCL 5 MG PO TABS
5.0000 mg | ORAL_TABLET | Freq: Once | ORAL | Status: AC
  Administered 2017-04-17: 5 mg via ORAL
  Filled 2017-04-17: qty 1

## 2017-04-17 MED ORDER — ONDANSETRON HCL 4 MG/2ML IJ SOLN
INTRAMUSCULAR | Status: AC
  Filled 2017-04-17: qty 2

## 2017-04-17 MED ORDER — SUGAMMADEX SODIUM 200 MG/2ML IV SOLN
INTRAVENOUS | Status: DC | PRN
Start: 2017-04-17 — End: 2017-04-17
  Administered 2017-04-17: 115.2 mg via INTRAVENOUS

## 2017-04-17 MED ORDER — HYDROMORPHONE HCL 1 MG/ML IJ SOLN
0.2000 mg | INTRAMUSCULAR | Status: DC | PRN
  Administered 2017-04-17: 0.6 mg via INTRAVENOUS
  Filled 2017-04-17: qty 1

## 2017-04-17 MED ORDER — DEXAMETHASONE SODIUM PHOSPHATE 10 MG/ML IJ SOLN
INTRAMUSCULAR | Status: DC | PRN
  Administered 2017-04-17: 10 mg via INTRAVENOUS

## 2017-04-17 MED ORDER — NALOXONE HCL 0.4 MG/ML IJ SOLN
INTRAMUSCULAR | Status: AC
  Filled 2017-04-17: qty 1

## 2017-04-17 MED ORDER — SUGAMMADEX SODIUM 200 MG/2ML IV SOLN
INTRAVENOUS | Status: AC
  Filled 2017-04-17: qty 2

## 2017-04-17 MED ORDER — ROCURONIUM BROMIDE 100 MG/10ML IV SOLN
INTRAVENOUS | Status: DC | PRN
  Administered 2017-04-17: 50 mg via INTRAVENOUS

## 2017-04-17 MED ORDER — ACETAMINOPHEN 500 MG PO TABS: 1000.0000 mg | ORAL_TABLET | Freq: Four times a day (QID) | ORAL | 0 refills | Status: AC

## 2017-04-17 MED ORDER — OXYCODONE HCL 5 MG PO CAPS: 5.0000 mg | ORAL_CAPSULE | Freq: Four times a day (QID) | ORAL | 0 refills | Status: AC | PRN

## 2017-04-17 MED ORDER — KETOROLAC TROMETHAMINE 30 MG/ML IJ SOLN
30.0000 mg | Freq: Once | INTRAMUSCULAR | Status: AC
  Administered 2017-04-17: 30 mg via INTRAVENOUS

## 2017-04-17 MED ORDER — LIDOCAINE HCL (CARDIAC) 20 MG/ML IV SOLN
INTRAVENOUS | Status: DC | PRN
  Administered 2017-04-16: 80 mg via INTRAVENOUS

## 2017-04-17 MED ORDER — SIMETHICONE 80 MG PO CHEW
80.0000 mg | CHEWABLE_TABLET | Freq: Four times a day (QID) | ORAL | Status: DC | PRN
  Administered 2017-04-18: 80 mg via ORAL
  Filled 2017-04-17: qty 1

## 2017-04-17 MED ORDER — PHENYLEPHRINE HCL 10 MG/ML IJ SOLN
INTRAMUSCULAR | Status: DC | PRN
  Administered 2017-04-17 (×2): 100 ug via INTRAVENOUS

## 2017-04-17 MED ORDER — KETOROLAC TROMETHAMINE 30 MG/ML IJ SOLN
INTRAMUSCULAR | Status: AC
  Administered 2017-04-17: 30 mg via INTRAVENOUS
  Filled 2017-04-17: qty 1

## 2017-04-17 MED ORDER — OXYCODONE HCL 5 MG PO TABS
5.0000 mg | ORAL_TABLET | Freq: Four times a day (QID) | ORAL | Status: DC | PRN
  Administered 2017-04-17 – 2017-04-18 (×3): 5 mg via ORAL
  Filled 2017-04-17 (×3): qty 1

## 2017-04-17 MED ORDER — SUCCINYLCHOLINE CHLORIDE 20 MG/ML IJ SOLN
INTRAMUSCULAR | Status: DC | PRN
  Administered 2017-04-16: 60 mg via INTRAVENOUS

## 2017-04-17 MED ORDER — ACETAMINOPHEN 10 MG/ML IV SOLN
INTRAVENOUS | Status: AC
Start: 2017-04-17 — End: ?
  Filled 2017-04-17: qty 100

## 2017-04-17 MED ORDER — ACETAMINOPHEN 500 MG PO TABS
1000.0000 mg | ORAL_TABLET | Freq: Four times a day (QID) | ORAL | Status: DC
  Administered 2017-04-17 – 2017-04-18 (×5): 1000 mg via ORAL
  Filled 2017-04-17 (×5): qty 2

## 2017-04-17 MED ORDER — ONDANSETRON HCL 4 MG/2ML IJ SOLN
4.0000 mg | Freq: Once | INTRAMUSCULAR | Status: AC | PRN
  Administered 2017-04-17: 4 mg via INTRAVENOUS

## 2017-04-17 MED ORDER — FENTANYL CITRATE (PF) 100 MCG/2ML IJ SOLN
INTRAMUSCULAR | Status: DC | PRN
  Administered 2017-04-16 – 2017-04-17 (×2): 50 ug via INTRAVENOUS

## 2017-04-17 MED ORDER — LACTATED RINGERS IV SOLN
INTRAVENOUS | Status: DC
  Administered 2017-04-17: 08:00:00 via INTRAVENOUS

## 2017-04-17 MED ORDER — MENTHOL 3 MG MT LOZG
1.0000 | LOZENGE | OROMUCOSAL | Status: DC | PRN
  Filled 2017-04-17: qty 9

## 2017-04-17 MED ORDER — GABAPENTIN 300 MG PO CAPS: 900.0000 mg | ORAL_CAPSULE | Freq: Every day | ORAL | 0 refills | Status: AC

## 2017-04-17 MED ORDER — BUPIVACAINE HCL 0.5 % IJ SOLN
INTRAMUSCULAR | Status: DC | PRN
  Administered 2017-04-17: 13 mL

## 2017-04-17 MED ORDER — CELECOXIB 200 MG PO CAPS: 200.0000 mg | ORAL_CAPSULE | Freq: Two times a day (BID) | ORAL | 0 refills | Status: AC

## 2017-04-17 MED ORDER — ONDANSETRON HCL 4 MG PO TABS: 4.0000 mg | ORAL_TABLET | Freq: Four times a day (QID) | ORAL | Status: DC | PRN

## 2017-04-17 MED ORDER — GABAPENTIN 300 MG PO CAPS
900.0000 mg | ORAL_CAPSULE | Freq: Every day | ORAL | Status: DC
  Filled 2017-04-17 (×2): qty 3

## 2017-04-17 MED ORDER — ONDANSETRON HCL 4 MG/2ML IJ SOLN
4.0000 mg | Freq: Four times a day (QID) | INTRAMUSCULAR | Status: DC | PRN
  Administered 2017-04-17: 4 mg via INTRAVENOUS
  Filled 2017-04-17: qty 2

## 2017-04-17 NOTE — Transfer of Care (Signed)
Immediate Anesthesia Transfer of Care Note  Patient: Kayla Rodriguez  Procedure(s) Performed: Procedure(s): DIAGNOSTIC LAPAROSCOPY WITH REMOVAL OF ECTOPIC PREGNANCY (N/A) LAPAROSCOPIC UNILATERAL SALPINGECTOMY (N/A) DILATATION AND CURETTAGE (N/A)  Patient Location: PACU  Anesthesia Type:General  Level of Consciousness: sedated  Airway & Oxygen Therapy: Patient Spontanous Breathing and Patient connected to face mask oxygen  Post-op Assessment: Report given to RN and Post -op Vital signs reviewed and stable  Post vital signs: Reviewed and stable  Last Vitals:  Vitals:   04/16/17 2114 04/16/17 2335  BP: 104/67 104/73  Pulse: 62 60  Resp: 16 17  Temp:      Last Pain:  Vitals:   04/16/17 2336  TempSrc:   PainSc: 3          Complications: No apparent anesthesia complications

## 2017-04-17 NOTE — Discharge Summary (Signed)
GynecologicalDischarge Summary  Patient Name: Kayla Rodriguez DOB: 31-May-1991 MRN: [id number]  Date of Admission: 04/16/2017 Date of Discharge: 04/17/2017  Hospital course:   The patient was admitted after her surgery, a dx lap for ectopic pregnancy with left salpingectomy and D&C.  Postoperatively she was admitted to the floor with po meds and diet advance. Postoperative labs were stable   Patient passed a voiding trial without difficulty.   Patient was felt to be stable for discharge on postoperative day number 1 when she was tolerating a regular diet, pain was controlled with po pain medications, and she was ambulating and voiding without difficulty. Vital signs were stable and physical exam remained benign throughout her hospital stay. Incision was clean,dry, and intact. She will follow up per below for post-op check..  Rx given for ERAS meds given    She was given specific instructions and numbers to call in written and verbal format. She verbalized understanding, agrees with the plan of care, and all questions answered to her satisfaction.  Discharge Physical Exam:  BP 105/63 (BP Location: Left Arm)   Pulse 68   Temp 98 F (36.7 C) (Oral)   Resp 18   Ht 5\' 2"  (1.575 m)   Wt 57.6 kg (127 lb)   LMP 03/04/2017 (Exact Date)   SpO2 97%   BMI 23.23 kg/m   General: NAD CV: RRR Pulm: CTABL, nl effort ABD: s/nd/nt Incision: c/d/i  DVT Evaluation: LE non-ttp, no evidence of DVT on exam.  Hemoglobin  Date Value Ref Range Status  04/17/2017 11.8 (L) 12.0 - 16.0 g/dL Final   HGB  Date Value Ref Range Status  07/05/2014 11.4 (L) 12.0 - 16.0 g/dL Final   HCT  Date Value Ref Range Status  04/17/2017 35.0 35.0 - 47.0 % Final  07/05/2014 35.8 35.0 - 47.0 % Final      Plan:  Kayla Rodriguez was discharged to home in good condition. [facility] weeks   Discharge Medications: Allergies as of 04/17/2017   No Known Allergies      Medication List    STOP taking these medications   doxycycline 100 MG capsule Commonly known as:  VIBRAMYCIN   metroNIDAZOLE 500 MG tablet Commonly known as:  FLAGYL     TAKE these medications   acetaminophen 500 MG tablet Commonly known as:  TYLENOL Take 2 tablets (1,000 mg total) by mouth every 6 (six) hours.   celecoxib 200 MG capsule Commonly known as:  CELEBREX Take 1 capsule (200 mg total) by mouth 2 (two) times daily.   Doxylamine-Pyridoxine 10-10 MG Tbec Take 1 tablet by mouth 2 (two) times daily.   gabapentin 300 MG capsule Commonly known as:  NEURONTIN Take 3 capsules (900 mg total) by mouth at bedtime.   oxycodone 5 MG capsule Commonly known as:  OXY-IR Take 1 capsule (5 mg total) by mouth every 6 (six) hours as needed for pain.       Follow-up Information    Christeen DouglasBeasley, Darden Flemister, MD Follow up in 2 week(s).   Specialty:  Obstetrics and Gynecology Why:  For postop check Contact information: [address] [place] [address] [phone]           Signed: Christeen DouglasBethany Claiborne Stroble, MD 8:51 AM

## 2017-04-17 NOTE — Discharge Instructions (Signed)
Laparoscopic Tubal Removal for Ectopic Pregnancy Discharge Instructions Laparoscopic tubal removal is a procedure that removes the fallopian tube containing the ectopic pregnancy. RISKS AND COMPLICATIONS   Infection.  Bleeding.  Injury to surrounding organs.  Anesthetic side effects.  Failure of the procedure.  Risks of future ectopic pregnancy on the other side PROCEDURE   You may be given a medicine to help you relax (sedative) before the procedure. You will be given a medicine to make you sleep (general anesthetic) during the procedure.  A tube will be put down your throat to help your breath while under general anesthesia.  Two small cuts (incisions) are made in the lower abdominal area and one incision is made near the belly button.  Your abdominal area will be inflated with a safe gas (carbon dioxide). This helps give the surgeon room to operate, visualize, and helps the surgeon avoid other organs.  A thin, lighted tube (laparoscope) with a camera attached is inserted into your abdomen through the incision near the belly button. Other small instruments are also inserted through the other abdominal incisions.  The fallopian tube is located and are removed.  After the fallopian tube is removed, the gas is released from the abdomen.  The incisions will be closed with stitches (sutures), and Dermabond. A bandage may be placed over the incisions. AFTER THE PROCEDURE   You will also have some mild abdominal discomfort for 3-7 days. You will be given pain medicine to ease any discomfort.  As long as there are no problems, you may be allowed to go home. Someone will need to drive you home and be with you for at least 24 hours once home.  You may have some mild discomfort in the throat. This is from the tube placed in your throat while you were sleeping.  You may experience discomfort in the shoulder area from some trapped air between the liver and diaphragm. This sensation is  normal and will slowly go away on its own. HOME CARE INSTRUCTIONS   Take all medicines as directed.  Only take over-the-counter or prescription medicines for pain, discomfort, or fever as directed by your caregiver.  Resume daily activities as directed.  Showers are preferred over baths.  You may resume sexual activities in 1 week or as directed.  Do not drive while taking narcotics. SEEK MEDICAL CARE IF: .  There is increasing abdominal pain.  You feel lightheaded or faint.  You have the chills.  You have an oral temperature above 102 F (38.9 C).  There is pus-like (purulent) drainage from any of the wounds.  You are unable to pass gas or have a bowel movement.  You feel sick to your stomach (nauseous) or throw up (vomit). MAKE SURE YOU:   Understand these instructions.  Will watch your condition.  Will get help right away if you are not doing well or get worse.  ExitCare Patient Information 2013 [place], [place].        Ectopic Pregnancy An ectopic pregnancy happens when a fertilized egg grows outside the uterus. A pregnancy cannot live outside of the uterus. This problem often happens in the fallopian tube. It is often caused by damage to the fallopian tube. If this problem is found early, you may be treated with medicine. If your tube tears or bursts open (ruptures), you will bleed inside. This is an emergency. You will need surgery. Get help right away. What are the signs or symptoms? You may have normal pregnancy symptoms at first.  These include:  Missing your period.  Feeling sick to your stomach (nauseous).  Being tired.  Having tender breasts.  Then, you may start to have symptoms that are not normal. These include:  Pain with sex (intercourse).  Bleeding from the vagina. This includes light bleeding (spotting).  Belly (abdomen) or lower belly cramping or pain. This may be felt on one side.  A fast heartbeat (pulse).  Passing out  (fainting) after going poop (bowel movement).  If your tube tears, you may have symptoms such as:  Really bad pain in the belly or lower belly. This happens suddenly.  Dizziness.  Passing out.  Shoulder pain.  Get help right away if: You have any of these symptoms. This is an emergency. This information is not intended to replace advice given to you by your health care provider. Make sure you discuss any questions you have with your health care provider. Document Released: 01/03/2009 Document Revised: 03/14/2016 Document Reviewed: 05/19/2013 Elsevier Interactive Patient Education  2017 [employer]

## 2017-04-17 NOTE — Anesthesia Procedure Notes (Signed)
Procedure Name: Intubation Date/Time: 04/17/2017 12:17 AM Performed by: Nelda Marseille Pre-anesthesia Checklist: Patient identified, Patient being monitored, Timeout performed, Emergency Drugs available and Suction available Patient Re-evaluated:Patient Re-evaluated prior to inductionOxygen Delivery Method: Circle system utilized Preoxygenation: Pre-oxygenation with 100% oxygen Intubation Type: IV induction Ventilation: Mask ventilation without difficulty Laryngoscope Size: Mac and 3 Grade View: Grade II Tube type: Oral Tube size: 7.0 mm Number of attempts: 1 Airway Equipment and Method: Stylet Placement Confirmation: ETT inserted through vocal cords under direct vision,  positive ETCO2 and breath sounds checked- equal and bilateral Secured at: 21 cm Tube secured with: Tape Dental Injury: Teeth and Oropharynx as per pre-operative assessment

## 2017-04-17 NOTE — Op Note (Signed)
Kayla NortonAshley Rodriguez PROCEDURE DATE: 04/16/2017 - 04/17/2017  PREOPERATIVE DIAGNOSIS: ectopic pregnancy POSTOPERATIVE DIAGNOSIS: left fallopian tube ectopic pregnancy PROCEDURE: D&C, Laparoscopic left salpingectomy and removal of ectopic pregnancy, evacuation of hemoperitoneum SURGEON:  Dr. Christeen DouglasBethany Carlina Derks ANESTHESIOLOGIST: Berdine Addisonhomas, Mathai, MD Anesthesiologist: Berdine Addisonhomas, Mathai, MD CRNA: Junious SilkNoles, Mark, CRNA  INDICATIONS: 26 y.o. [id number] at approx 9635w1d here with abdominal pain, positive pregnancy test, heavy vaginal bleeding and a mass in the left adnexa.  Please refer to preoperative notes for more details. Patient was counseled regarding need for laparoscopic salpingectomy. Risks of surgery including bleeding which may require transfusion or reoperation, infection, injury to bowel or other surrounding organs, need for additional procedures including laparotomy and other postoperative/anesthesia complications were explained to patient.  She consented verbally and requested surgery, after a thorough discussion of conservative management options and discussion with her family. Written informed consent was obtained, but after the patient was intubated, the consent form was evaluated and found that the patient had not signed it.   The preop team was questioned and we confirmed that the ER nurse, the surgeon, and the periop team had all witnessed her verbal consent to the surgery and to the probability of permanent inability to spontaneously conceive in the future with removal of her remaining left tube. The determination was made to proceed with surgery, as we were all certain at each stage of the preop preparation that the patient was electing to proceed with surgery. A safety zone portal note was placed and a call to the administrator to confirm that proceeding with surgery was appropriate.   FINDINGS:  Moderate amount of hemoperitoneum estimated to be about 60 of blood and clots.  Dilated left fallopian tube  containing ectopic gestation. Small normal appearing uterus, absent  right fallopian tube, right ovary and left ovary.  ANESTHESIA: General INTRAVENOUS FLUIDS: 900 ml ESTIMATED BLOOD LOSS: 25 ml URINE OUTPUT: 25 ml SPECIMENS: Left fallopian tube containing ectopic gestation, endometrial currettings COMPLICATIONS: None immediate  PROCEDURE IN DETAIL:  The patient was taken to the operating room where general anesthesia was administered and was found to be adequate.  She was placed in the dorsal lithotomy position, and was prepped and draped in a sterile manner.  D&C paragraph Her bladder was catheterized for an estimated amount of clear, yellow urine. A weighed speculum was then placed in the patient's vagina and a single tooth tenaculum was applied to the anterior lip of the cervix.  Her cervix was serially dilated to 15 [place] using Hanks dilators. A sharp curettage was then performed until there was a gritty texture in all four quadrants. The tenaculum was removed from the anterior lip of the cervix and the vaginal speculum was removed after applying pressure for good hemostasis.   Attention was turned to the abdomen where an umbilical incision was made with the scalpel.  The Optiview 5-mm trocar and sleeve were then advanced without difficulty. Survey of the entry site was noted to be without damage. The abdomen was then insufflated with carbon dioxide gas and adequate pneumoperitoneum was obtained.  A survey of the patient's pelvis and abdomen revealed the findings above.  A 11-mm port was placed on the left under direct visualization, and a 5-mm right lower quadrant port was then placed under direct visualization.  The Nezhat suction irrigator was then used to suction the hemoperitoneum and irrigate the pelvis.  A survey of the pelvis noted ureter peristalsis deep to the ovarian ligament and not in the surgical field. Attention was then turned  to the left fallopian tube and ovary. The left  tube was separated from the underlying mesosalpinx and uterine attachment using the Ligasure instrument.  Good hemostasis was noted.  The specimen was placed in an EndoCatch bag and removed from the abdomen intact.  The abdomen was desufflated, and all instruments were removed.  The fascial incision of the 10-mm site was reapproximated with a 0 Vicryl figure-of-eight stitch; and all skin incisions were closed with 4-0 Vicryl and Dermabond. The patient tolerated the procedure well.  All instruments, needles, and sponge counts were correct x 2. The patient was taken to the recovery room in stable condition.    Cline Cools, MD, MPH

## 2017-04-17 NOTE — Anesthesia Postprocedure Evaluation (Signed)
Anesthesia Post Note  Patient: Merita Nortonshley Clayson  Procedure(s) Performed: Procedure(s) (LRB): DIAGNOSTIC LAPAROSCOPY WITH REMOVAL OF ECTOPIC PREGNANCY (N/A) LAPAROSCOPIC UNILATERAL SALPINGECTOMY (N/A) DILATATION AND CURETTAGE (N/A)  Patient location during evaluation: PACU Anesthesia Type: General Level of consciousness: awake and alert Pain management: pain level controlled Vital Signs Assessment: post-procedure vital signs reviewed and stable Respiratory status: spontaneous breathing, nonlabored ventilation, respiratory function stable and patient connected to nasal cannula oxygen Cardiovascular status: blood pressure returned to baseline and stable Postop Assessment: no signs of nausea or vomiting Anesthetic complications: no     Last Vitals:  Vitals:   04/17/17 0422 04/17/17 0523  BP: (!) 93/50 (!) 94/52  Pulse: 67 63  Resp: 16 16  Temp: 36.6 C 36.4 C    Last Pain:  Vitals:   04/17/17 0530  TempSrc:   PainSc: 5                  Sascha Baugher S

## 2017-04-17 NOTE — Anesthesia Post-op Follow-up Note (Cosign Needed)
Anesthesia QCDR form completed.        

## 2017-04-18 LAB — SURGICAL PATHOLOGY

## 2017-04-18 MED ORDER — OXYCODONE HCL 5 MG PO TABS: 5.0000 mg | ORAL_TABLET | Freq: Four times a day (QID) | ORAL | 0 refills | Status: AC | PRN

## 2017-04-18 NOTE — Discharge Summary (Signed)
GynecologicalDischarge Summary  Patient Name: Kayla Rodriguez DOB: [birth year] MRN: [id number]  Date of Admission: 04/16/2017 Date of Discharge: 04/18/2017  Hospital course:   The patient was admitted after her surgery, a dx lap for ectopic pregnancy with left salpingectomy and D&C.  Postoperatively she was admitted to the floor with po meds and diet advance. Postoperative labs were stable   Patient passed a voiding trial without difficulty. However, her pelvic pain was significant enough to require iv meds for breakthrough on POD#1.  Patient was felt to be stable for discharge on postoperative day number 2 when she was tolerating a regular diet, pain was controlled with po pain medications, and she was ambulating and voiding without difficulty. Vital signs were stable and physical exam remained benign throughout her hospital stay. Incision was clean,dry, and intact. She will follow up per below for post-op check..  Rx given for ERAS meds given    She was given specific instructions and numbers to call in written and verbal format. She verbalized understanding, agrees with the plan of care, and all questions answered to her satisfaction.  Discharge Physical Exam:  BP (!) 98/58 (BP Location: Left Arm)   Pulse 72   Temp 98.2 F (36.8 C) (Oral)   Resp 17   Ht 5\' 2"  (1.575 m)   Wt 127 lb (57.6 kg)   LMP 03/04/2017 (Exact Date)   SpO2 100%   BMI 23.23 kg/m   General: NAD CV: RRR Pulm: CTABL, nl effort ABD: s/nd/nt Incision: c/d/i  DVT Evaluation: LE non-ttp, no evidence of DVT on exam.  Hemoglobin  Date Value Ref Range Status  04/17/2017 11.8 (L) 12.0 - 16.0 g/dL Final   HGB  Date Value Ref Range Status  07/05/2014 11.4 (L) 12.0 - 16.0 g/dL Final   HCT  Date Value Ref Range Status  04/17/2017 35.0 35.0 - 47.0 % Final  07/05/2014 35.8 35.0 - 47.0 % Final      Plan:  Kayla Nortonshley Leeds was discharged to home in good condition. [facility]  2 weeks   Discharge Medications: Allergies as of 04/18/2017   No Known Allergies     Medication List    STOP taking these medications   doxycycline 100 MG capsule Commonly known as:  VIBRAMYCIN   metroNIDAZOLE 500 MG tablet Commonly known as:  FLAGYL     TAKE these medications   acetaminophen 500 MG tablet Commonly known as:  TYLENOL Take 2 tablets (1,000 mg total) by mouth every 6 (six) hours.   celecoxib 200 MG capsule Commonly known as:  CELEBREX Take 1 capsule (200 mg total) by mouth 2 (two) times daily.   Doxylamine-Pyridoxine 10-10 MG Tbec Take 1 tablet by mouth 2 (two) times daily.   gabapentin 300 MG capsule Commonly known as:  NEURONTIN Take 3 capsules (900 mg total) by mouth at bedtime.   oxycodone 5 MG capsule Commonly known as:  OXY-IR Take 1 capsule (5 mg total) by mouth every 6 (six) hours as needed for pain.       Follow-up Information    Christeen DouglasBeasley, Ai Sonnenfeld, MD. Go on 05/02/2017.   Specialty:  Obstetrics and Gynecology Why:  For postop check 2:30 pm Contact information: [address] [place] [address] [phone]           Signed: Christeen DouglasBethany Toshiyuki Fredell, MD 5:01 PM

## 2017-04-18 NOTE — Progress Notes (Signed)
Discharge order received from doctor. Reviewed discharge instructions and prescriptions with patient and answered all questions. Follow up appointment given. Patient verbalized understanding. Patient discharged home in stable condition via wheelchair by nursing/auxillary.    Liese Dizdarevic Garner, RN  

## 2018-05-21 IMAGING — US US PELVIS COMPLETE
1 series · 13 of 25 positions shown · non-contrast
Comparison: Prior ultrasound from 05/14/2014.

CLINICAL DATA: Initial evaluation for acute left adnexal pain.

EXAM:
TRANSABDOMINAL AND TRANSVAGINAL ULTRASOUND OF PELVIS
DOPPLER ULTRASOUND OF OVARIES
TECHNIQUE: Both transabdominal and transvaginal ultrasound examinations of the
pelvis were performed. Transabdominal technique was performed for
global imaging of the pelvis including uterus, ovaries, adnexal
regions, and pelvic cul-de-sac.
It was necessary to proceed with endovaginal exam following the
transabdominal exam to visualize the uterus and ovaries. Color and
duplex Doppler ultrasound was utilized to evaluate blood flow to the
ovaries.

[Series 1: us pelvis complete · 0.21mm/px · 13 of 73 slices shown]
[im 1/73]
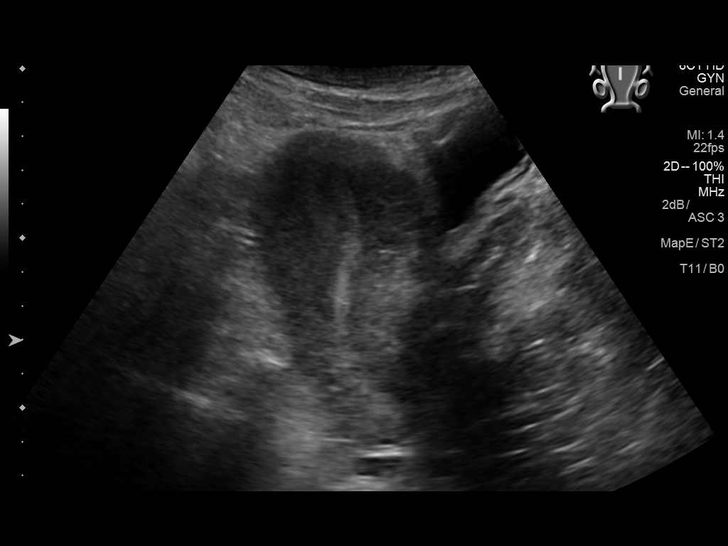
[im 7/73]
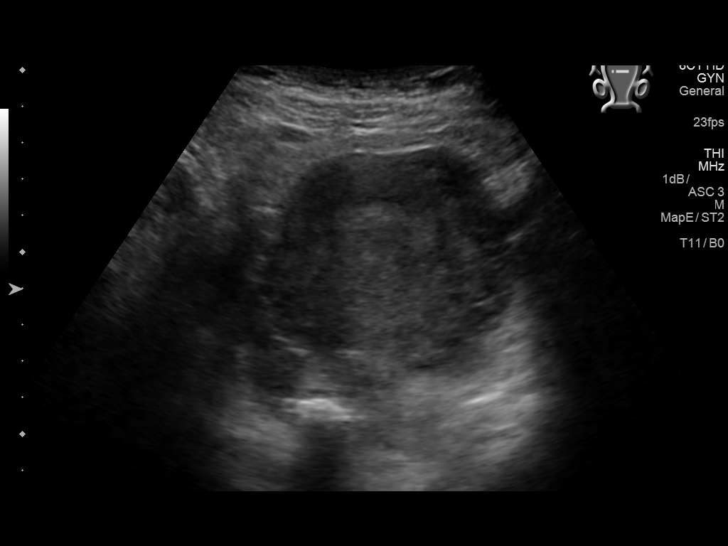
[im 13/73]
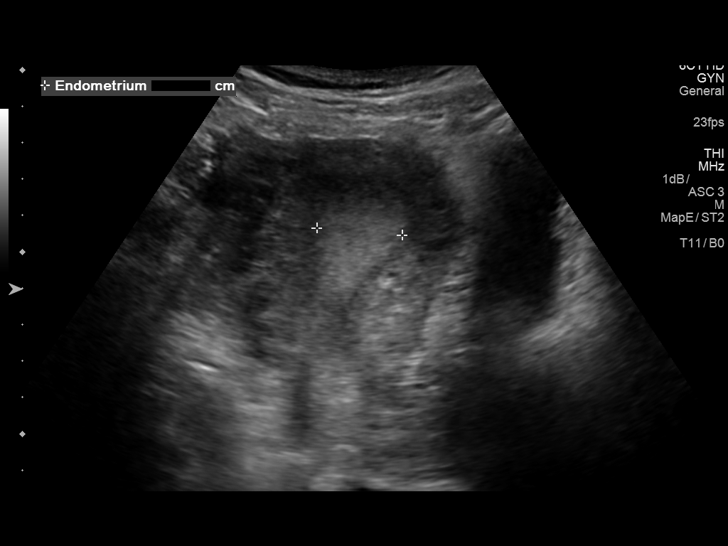
[im 19/73]
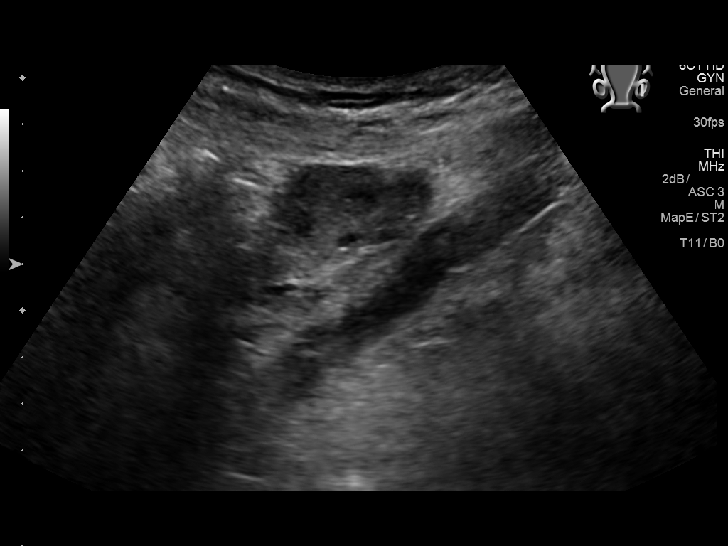
[im 25/73]
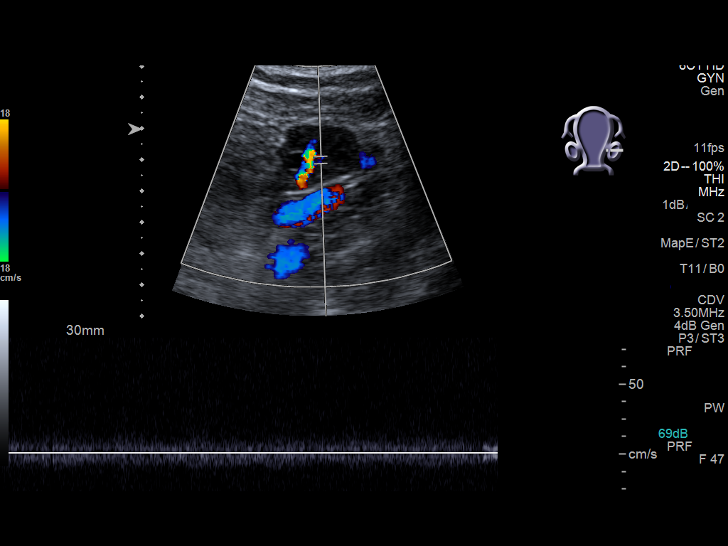
[im 31/73]
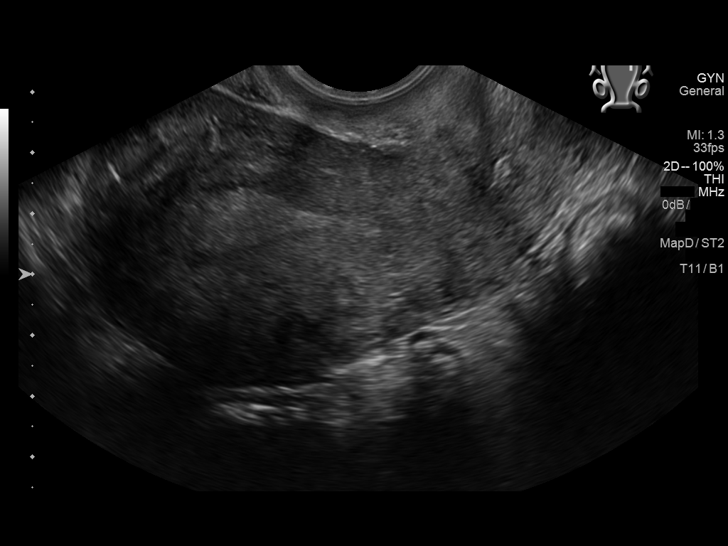
[im 37/73]
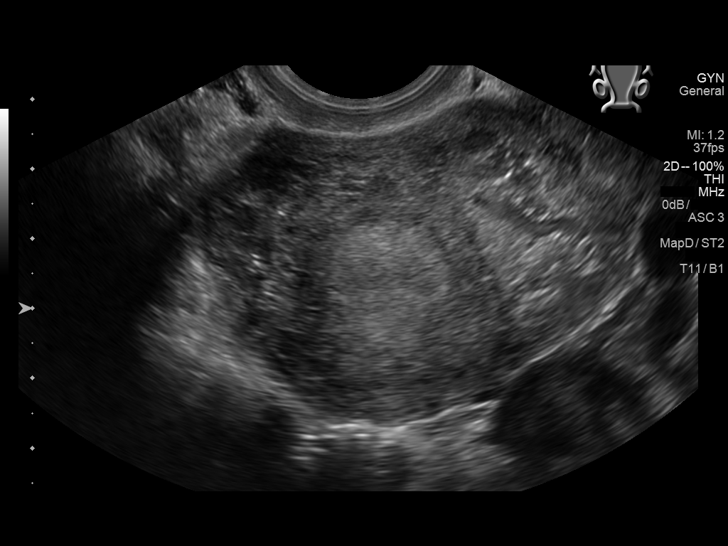
[im 43/73]
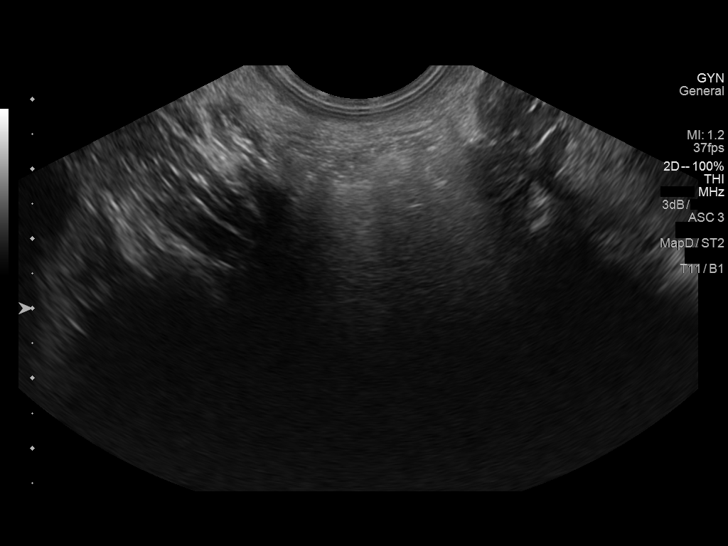
[im 49/73]
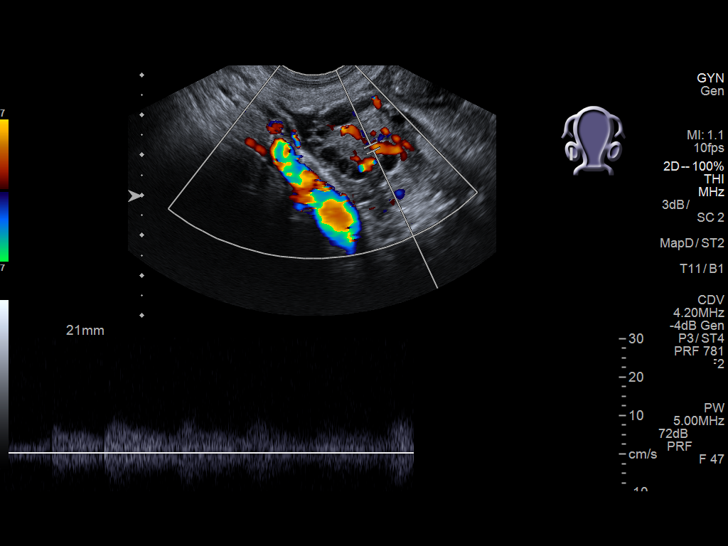
[im 55/73]
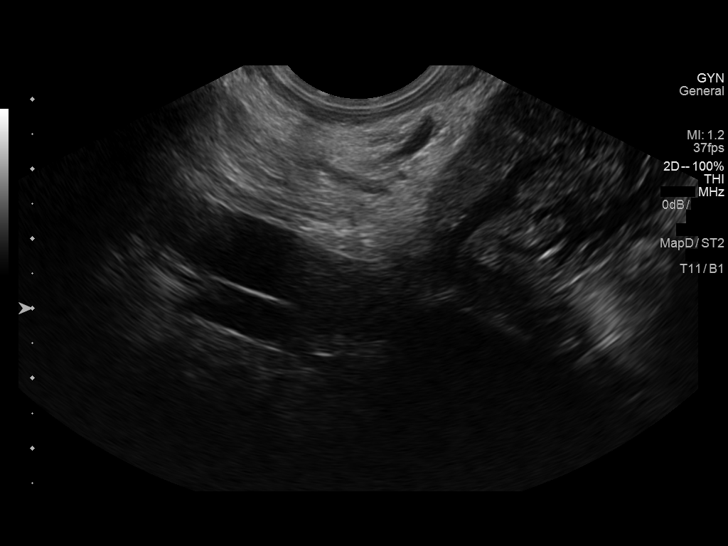
[im 61/73]
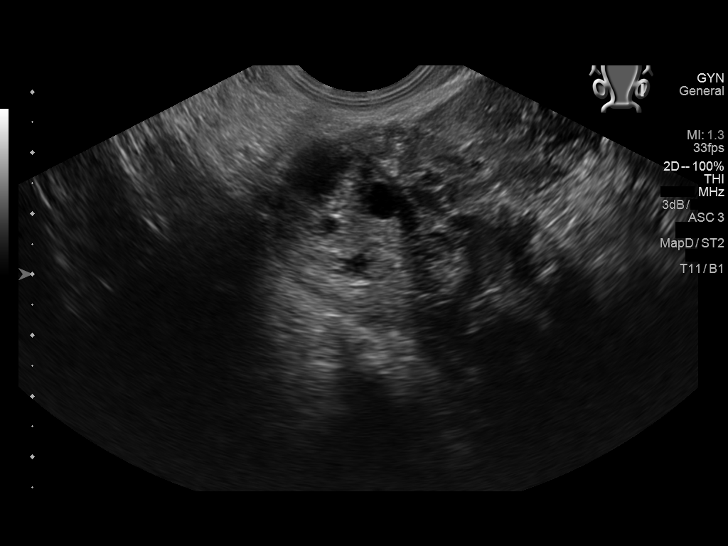
[im 67/73]
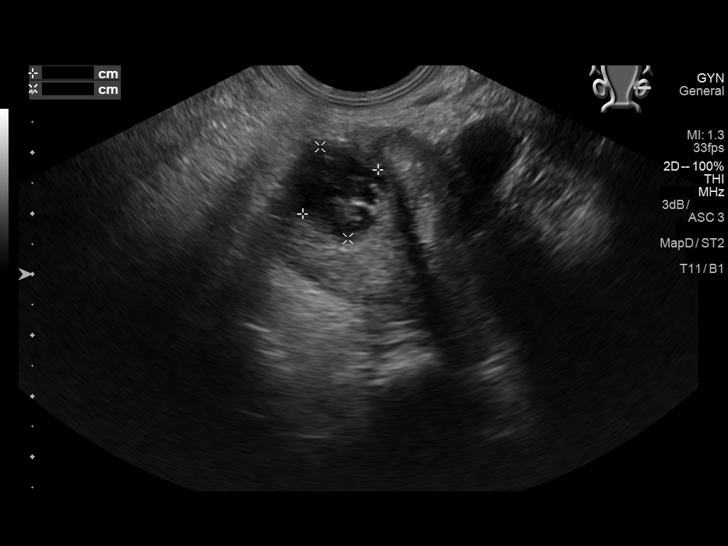
[im 73/73]
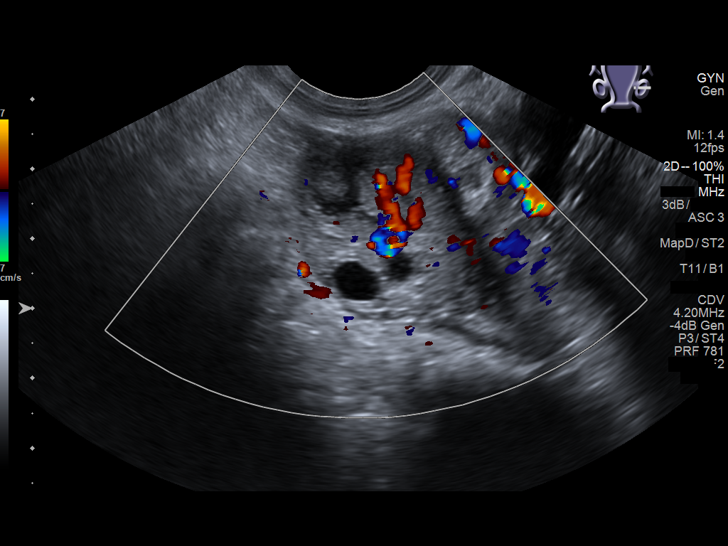

[13 of 25 positions shown; findings below may reference images not displayed]

FINDINGS: Uterus

Measurements: 9.5 x 5.0 x 5.5 cm. No fibroids or other mass
visualized.

Endometrium

Thickness: 14.3 mm.  No focal abnormality visualized.

Right ovary

Measurements: 3.4 x 2.0 x 2.6 cm. Normal appearance/no adnexal mass.

Left ovary

Measurements: 3.2 x 3.1 x 2.6 cm. Normal appearance/no adnexal mass.
Small 1.3 x 1.4 x 1.6 cm complex hypoechoic lesion, likely a small
physiologic cyst. 
Pulsed Doppler evaluation of both ovaries demonstrates normal
low-resistance arterial and venous waveforms.

Other findings

Small volume free fluid, likely physiologic.
IMPRESSION: 1. 1.6 cm mildly complex left ovarian cyst, likely a normal
physiologic cyst. Associated small volume free physiologic fluid
within the pelvis.
2. No other acute abnormality within the pelvis.
